# Patient Record
Sex: Male | Born: 1945
Health system: Southern US, Community
[De-identification: ages and names within clinical notes are randomized; demographics above are authoritative.]

## PROBLEM LIST (undated history)

## (undated) DIAGNOSIS — M1990 Unspecified osteoarthritis, unspecified site: Secondary | ICD-10-CM

## (undated) DIAGNOSIS — Z5189 Encounter for other specified aftercare: Secondary | ICD-10-CM

## (undated) DIAGNOSIS — C61 Malignant neoplasm of prostate: Secondary | ICD-10-CM

## (undated) DIAGNOSIS — J40 Bronchitis, not specified as acute or chronic: Secondary | ICD-10-CM

## (undated) DIAGNOSIS — C801 Malignant (primary) neoplasm, unspecified: Secondary | ICD-10-CM

## (undated) DIAGNOSIS — T148XXA Other injury of unspecified body region, initial encounter: Secondary | ICD-10-CM

## (undated) DIAGNOSIS — F32A Depression, unspecified: Secondary | ICD-10-CM

## (undated) DIAGNOSIS — F329 Major depressive disorder, single episode, unspecified: Secondary | ICD-10-CM

## (undated) DIAGNOSIS — IMO0001 Reserved for inherently not codable concepts without codable children: Secondary | ICD-10-CM

## (undated) DIAGNOSIS — I10 Essential (primary) hypertension: Secondary | ICD-10-CM

## (undated) DIAGNOSIS — K5790 Diverticulosis of intestine, part unspecified, without perforation or abscess without bleeding: Secondary | ICD-10-CM

## (undated) DIAGNOSIS — Z86718 Personal history of other venous thrombosis and embolism: Secondary | ICD-10-CM

## (undated) HISTORY — DX: Personal history of other venous thrombosis and embolism: Z86.718

## (undated) HISTORY — DX: Encounter for other specified aftercare: Z51.89

## (undated) HISTORY — DX: Diverticulosis of intestine, part unspecified, without perforation or abscess without bleeding: K57.90

## (undated) HISTORY — PX: HERNIA REPAIR: SHX51

## (undated) HISTORY — DX: Other injury of unspecified body region, initial encounter: T14.8XXA

## (undated) HISTORY — DX: Depression, unspecified: F32.A

## (undated) HISTORY — DX: Bronchitis, not specified as acute or chronic: J40

## (undated) HISTORY — DX: Major depressive disorder, single episode, unspecified: F32.9

## (undated) HISTORY — DX: Unspecified osteoarthritis, unspecified site: M19.90

## (undated) HISTORY — PX: CARDIAC CATHETERIZATION: SHX172

## (undated) HISTORY — PX: OTHER SURGICAL HISTORY: SHX169

## (undated) HISTORY — PX: PROSTATECTOMY: SHX69

## (undated) HISTORY — DX: Reserved for inherently not codable concepts without codable children: IMO0001

## (undated) HISTORY — DX: Essential (primary) hypertension: I10

---

## 1998-12-21 ENCOUNTER — Encounter: Payer: Self-pay | Admitting: Emergency Medicine

## 1998-12-21 ENCOUNTER — Emergency Department (HOSPITAL_COMMUNITY): Admission: EM | Admit: 1998-12-21 | Discharge: 1998-12-21 | Payer: Self-pay | Admitting: Emergency Medicine

## 1999-04-22 ENCOUNTER — Ambulatory Visit (HOSPITAL_COMMUNITY): Admission: RE | Admit: 1999-04-22 | Discharge: 1999-04-22 | Payer: Self-pay | Admitting: *Deleted

## 1999-04-22 ENCOUNTER — Encounter (INDEPENDENT_AMBULATORY_CARE_PROVIDER_SITE_OTHER): Payer: Self-pay | Admitting: Specialist

## 1999-08-29 ENCOUNTER — Observation Stay (HOSPITAL_COMMUNITY): Admission: EM | Admit: 1999-08-29 | Discharge: 1999-08-30 | Payer: Self-pay | Admitting: Emergency Medicine

## 1999-08-29 ENCOUNTER — Encounter: Payer: Self-pay | Admitting: Emergency Medicine

## 1999-12-01 ENCOUNTER — Encounter: Admission: RE | Admit: 1999-12-01 | Discharge: 1999-12-01 | Payer: Self-pay | Admitting: Internal Medicine

## 1999-12-01 ENCOUNTER — Encounter: Payer: Self-pay | Admitting: Internal Medicine

## 1999-12-21 ENCOUNTER — Encounter: Payer: Self-pay | Admitting: Internal Medicine

## 1999-12-21 ENCOUNTER — Encounter: Admission: RE | Admit: 1999-12-21 | Discharge: 1999-12-21 | Payer: Self-pay | Admitting: Internal Medicine

## 2004-04-16 ENCOUNTER — Emergency Department (HOSPITAL_COMMUNITY): Admission: EM | Admit: 2004-04-16 | Discharge: 2004-04-17 | Payer: Self-pay | Admitting: Emergency Medicine

## 2004-04-24 ENCOUNTER — Emergency Department (HOSPITAL_COMMUNITY): Admission: EM | Admit: 2004-04-24 | Discharge: 2004-04-24 | Payer: Self-pay | Admitting: Emergency Medicine

## 2004-05-25 ENCOUNTER — Emergency Department (HOSPITAL_COMMUNITY): Admission: EM | Admit: 2004-05-25 | Discharge: 2004-05-25 | Payer: Self-pay | Admitting: Emergency Medicine

## 2005-05-05 ENCOUNTER — Emergency Department (HOSPITAL_COMMUNITY): Admission: EM | Admit: 2005-05-05 | Discharge: 2005-05-06 | Payer: Self-pay | Admitting: Emergency Medicine

## 2005-09-07 ENCOUNTER — Emergency Department (HOSPITAL_COMMUNITY): Admission: EM | Admit: 2005-09-07 | Discharge: 2005-09-07 | Payer: Self-pay | Admitting: Emergency Medicine

## 2007-10-21 ENCOUNTER — Emergency Department (HOSPITAL_COMMUNITY): Admission: EM | Admit: 2007-10-21 | Discharge: 2007-10-21 | Payer: Self-pay | Admitting: Emergency Medicine

## 2007-10-22 ENCOUNTER — Emergency Department (HOSPITAL_COMMUNITY): Admission: EM | Admit: 2007-10-22 | Discharge: 2007-10-22 | Payer: Self-pay | Admitting: Emergency Medicine

## 2010-07-29 NOTE — H&P (Signed)
[place]. [facility]  Patient:    Glenn Logan, Glenn Logan                   MRN: [id number] Adm. Date:  [id number] Attending:  Thedore Mins                         History and Physical  CHIEF COMPLAINT: The patient comes in today complaining of intermittent chest pain over the last several hours prior to admission.  HISTORY OF PRESENT ILLNESS: The patient states that he had a very sharp pain and also this seemed to cause him some shortness of breath.  This may have been somewhat worse with deep inspiration.  According to his wife, the patient has had this intermittently over the last several weeks and was coming in to be evaluated.  The patient has no previous cardiac history but he is a smoker. The patient states that this pain is different because it would not go away today.  He has had this in the past and it typically would go away.  He did feel some generalized weakness associated with this.  PAST MEDICAL HISTORY: Unremarkable.  MEDICATIONS: None except p.r.n. Viagra.  SOCIAL HISTORY: The patient is married.  He does smoke and has occasional alcohol intake.  REVIEW OF SYSTEMS: As above.  The patient denies any visual change.  He does have arcus senilis.  There is no history of elevated cholesterol.  The patient denies any bowel or bladder changes.  Denies seizures, chills, or productive cough.  The patient has had no focal numbness or weakness.  PHYSICAL EXAMINATION:  VITAL SIGNS: Blood pressure 114/73, pulse 60, temperature 97.5 degrees, respiratory rate 20.  Oxygen saturation 100% on room air.  GENERAL: Well-developed, well-nourished man in no obvious distress.  HEENT: Head normocephalic, atraumatic.  PERRL.  Arcus senilis.  No papilledema.  TMs clear.  Oropharynx reveals pink and moist mucosa.  NECK: Supple, no thyromegaly, no adenopathy, no JVD.  HEART: Regular rhythm.  No murmurs, rubs, or gallops.  LUNGS: Clear bilaterally.  No rales  or wheezes.  ABDOMEN: Soft, nontender, nondistended.  Positive bowel sounds.  EXTREMITIES: No clubbing, cyanosis, or edema.  NEUROLOGIC: Moves all extremities.  ASSESSMENT/PLAN: Chest pain.  The patient has chest pain which is mildly atypical; however, he does have risk factor of being a smoker and being greater than age 64.  The patient has arcus senilis but his cholesterol was within a reasonable level when checked in the office.  At this point, I think the patient needs to be ruled out for myocardial infarction.  We will do this by enzymes and serial EKGs.  The patient will be placed on chest pain protocol and will be placed on heparin and nitroglycerin while this is worked up.  The patient will be seen by cardiology in the a.m.  The patient probably will not need a stress test done during hospitalization if he rules out; however, he will need to be set up for an exercise treadmill test or possibly Cardiolite as an outpatient.  The patient did have some nonspecific ST changes on his electrocardiogram which may be chronic and we will compare this to previous electrocardiogram done in the office. DD:  08/29/99 TD:  08/30/99 Job: [id number]
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
## 2010-11-22 ENCOUNTER — Encounter: Payer: Self-pay | Admitting: Gastroenterology

## 2010-12-09 LAB — POCT CARDIAC MARKERS
CKMB, poc: 1.1
Myoglobin, poc: 56.6

## 2010-12-09 LAB — POCT I-STAT, CHEM 8
BUN: 11
Calcium, Ion: 1.12
Chloride: 104
Creatinine, Ser: 1.5
Sodium: 140
TCO2: 27

## 2010-12-20 ENCOUNTER — Encounter: Payer: Self-pay | Admitting: Gastroenterology

## 2011-01-09 ENCOUNTER — Ambulatory Visit: Payer: Self-pay | Admitting: Gastroenterology

## 2011-03-20 ENCOUNTER — Ambulatory Visit: Payer: Self-pay | Admitting: Gastroenterology

## 2011-04-04 ENCOUNTER — Encounter: Payer: Self-pay | Admitting: Gastroenterology

## 2011-04-04 ENCOUNTER — Ambulatory Visit (INDEPENDENT_AMBULATORY_CARE_PROVIDER_SITE_OTHER): Payer: Self-pay | Admitting: Gastroenterology

## 2011-04-04 VITALS — BP 130/72 | HR 74 | Ht 66.5 in | Wt 140.8 lb

## 2011-04-04 DIAGNOSIS — K921 Melena: Secondary | ICD-10-CM

## 2011-04-04 DIAGNOSIS — Z7901 Long term (current) use of anticoagulants: Secondary | ICD-10-CM

## 2011-04-04 DIAGNOSIS — Z86010 Personal history of colonic polyps: Secondary | ICD-10-CM

## 2011-04-04 DIAGNOSIS — K5900 Constipation, unspecified: Secondary | ICD-10-CM

## 2011-04-04 MED ORDER — PEG-KCL-NACL-NASULF-NA ASC-C 100 G PO SOLR: 1.0000 | Freq: Once | ORAL | Status: DC
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
## 2011-04-04 NOTE — Patient Instructions (Signed)
You have been scheduled for a Colonoscopy with propofol. See separate instructions. Pick up your prep kit from your pharmacy.  Drink a 8 oz bottle of Magnesium Citrate on 04/15/11 ( 2 days prior to your procedure). Use Miralax mixing 17 grams in 8 oz of water 2-3 x daily for constipation.  cc: Juline Patch, MD

## 2011-04-06 ENCOUNTER — Telehealth: Payer: Self-pay

## 2011-04-06 NOTE — Telephone Encounter (Signed)
Dr. Thurston Hole returned my call and states that the patient can come off coumadin 5 days prior to his procedure but she will recommend at the patient's office visit tomorrow that he go on Lovenox bridge before the procedure. Dr. Randa Evens states that she will notify the patient tomorrow at his appt of her recommendations regarding stopping the coumadin 5 days prior. She states she has not received the faxed letter yet from [place] but hopes a verbal order is ok to use instead just in case she does not get the form. I told her that was fine as long as the patient gets the information at tomorrows visit. She agreed and will instruct the patient tomorrow.

## 2011-04-17 ENCOUNTER — Encounter: Payer: Self-pay | Admitting: Gastroenterology

## 2011-04-17 ENCOUNTER — Ambulatory Visit (AMBULATORY_SURGERY_CENTER): Payer: 59 | Admitting: Gastroenterology

## 2011-04-17 VITALS — BP 143/94 | HR 51 | Temp 96.4°F | Resp 20

## 2011-04-17 DIAGNOSIS — D126 Benign neoplasm of colon, unspecified: Secondary | ICD-10-CM

## 2011-04-17 DIAGNOSIS — Z86010 Personal history of colonic polyps: Secondary | ICD-10-CM

## 2011-04-17 DIAGNOSIS — K921 Melena: Secondary | ICD-10-CM

## 2011-04-17 DIAGNOSIS — K5900 Constipation, unspecified: Secondary | ICD-10-CM

## 2011-04-17 LAB — GLUCOSE, CAPILLARY
Glucose-Capillary: 151 mg/dL — ABNORMAL HIGH (ref 70–99)
Glucose-Capillary: 74 mg/dL (ref 70–99)
Glucose-Capillary: 209 mg/dL — ABNORMAL HIGH (ref 70–99)

## 2011-04-17 MED ORDER — SODIUM CHLORIDE 0.9 % IV SOLN: 500.0000 mL | INTRAVENOUS | Status: DC

## 2011-04-17 MED ORDER — DEXTROSE 5 % IV SOLN: INTRAVENOUS | Status: DC

## 2011-04-17 NOTE — Progress Notes (Signed)
Per the pt he took coumadin 10 mg last tues 04/11/2011.  He was on a lovenox bridge and he got mixed up and took lovenox Sunday 04/16/11 two times at 0930 and 23:45.  Natalie advised Dr. Russella Dar and he gave no orders.  MAW  D5W hung blood sugar was 74.  Dr. Russella Dar advised.  10:27 rechecked sugar 151. maw

## 2011-04-17 NOTE — Op Note (Signed)
[facility] [address] [place], [place]  [address]  COLONOSCOPY PROCEDURE REPORT PATIENT:  Lucifer, Soja  MR#:  [id number] BIRTHDATE:  [birth year], 65 yrs. old  GENDER:  male ENDOSCOPIST:  Judie Petit T. Russella Dar, MD, [facility]  PROCEDURE DATE:  04/17/2011 PROCEDURE:  Colonoscopy with snare polypectomy ASA CLASS:  Class III INDICATIONS:  1) hematochezia  2) constipation  3) history of pre-cancerous (adenomatous) colon polyps: 2001. MEDICATIONS:   MAC sedation, administered by CRNA, propofol (Diprivan) 300 mg IV DESCRIPTION OF PROCEDURE:   After the risks benefits and alternatives of the procedure were thoroughly explained, informed consent was obtained.  Digital rectal exam was performed and revealed external hemorrhoids.   The LB 180AL [id number] endoscope was introduced through the anus and advanced to the cecum, which was identified by both the appendix and ileocecal valve, without limitations.  The quality of the prep was good, using MoviPrep. The instrument was then slowly withdrawn as the colon was fully examined. <<PROCEDUREIMAGES>> FINDINGS:  A sessile polyp was found in the ascending colon. It was 7 mm in size. Polyp was snared without cautery. Retrieval was successful.  A sessile polyp was found in the descending colon. It was 5 mm in size. With standard forceps, biopsy was obtained and sent to pathology. Polyp was snared without cautery. Retrieval was successful. Mild diverticulosis was found in the sigmoid to descending colon.  Otherwise normal colonoscopy without other polyps, masses, vascular ectasias, or inflammatory changes. Retroflexed views in the rectum revealed internal hemorrhoids, moderate.  The time to cecum =  1.25  minutes. The scope was then withdrawn (time =  8.75  min) from the patient and the procedure completed. COMPLICATIONS:  None  ENDOSCOPIC IMPRESSION: 1) 7 mm sessile polyp in the ascending colon 2) 5 mm sessile polyp in the descending  colon 3) Mild diverticulosis in the sigmoid to descending colon 4) Internal and external hemorrhoids  RECOMMENDATIONS: 1) Hold aspirin, aspirin products, and anti-inflammatory medication for 2 weeks. 2) Await pathology results 3) High fiber diet with liberal fluid intake. 4) Repeat Colonoscopy in 5 years. 5) Resume Coumadin (warfarin) today and Lovenox tonight-follow all instructions from your prescribing MD.  Judie Petit T. Russella Dar, MD, [facility]  CC:  Juline Patch, MD  n. Rosalie DoctorVenita Lick. Latoya Maulding at 04/17/2011 11:01 AM  Luetta Nutting, [id number]
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
## 2011-04-17 NOTE — Patient Instructions (Signed)
Please read the handouts given to you by your recovery room nurse.    Your polyp results will be mailed to your home within two weeks.   You need to increase the fiber in your diet due to diverticulosis and hemorrhoids.    Resume your coumadin today and your lovenox tonight.   Please hold any aspirin or advil products for two weeks due to the use of heat on your polyps.   If you have any questions, please call us at [phone]. Thank-you.

## 2011-04-18 ENCOUNTER — Telehealth: Payer: Self-pay | Admitting: *Deleted

## 2011-04-18 NOTE — Telephone Encounter (Signed)
  Follow up Call-  Call back number 04/17/2011  Post procedure Call Back phone  # [phone] cell  Permission to leave phone message Yes     Patient questions:  Message left for patient to call us if necessary.

## 2011-04-21 ENCOUNTER — Encounter: Payer: Self-pay | Admitting: Gastroenterology

## 2011-08-08 ENCOUNTER — Ambulatory Visit: Payer: 59 | Admitting: Gastroenterology

## 2011-10-07 ENCOUNTER — Encounter (HOSPITAL_COMMUNITY): Payer: Self-pay | Admitting: Emergency Medicine

## 2011-10-07 DIAGNOSIS — Z87891 Personal history of nicotine dependence: Secondary | ICD-10-CM | POA: Insufficient documentation

## 2011-10-07 DIAGNOSIS — R0602 Shortness of breath: Secondary | ICD-10-CM | POA: Insufficient documentation

## 2011-10-07 DIAGNOSIS — J441 Chronic obstructive pulmonary disease with (acute) exacerbation: Secondary | ICD-10-CM | POA: Insufficient documentation

## 2011-10-07 DIAGNOSIS — Z79899 Other long term (current) drug therapy: Secondary | ICD-10-CM | POA: Insufficient documentation

## 2011-10-07 DIAGNOSIS — E119 Type 2 diabetes mellitus without complications: Secondary | ICD-10-CM | POA: Insufficient documentation

## 2011-10-07 DIAGNOSIS — Z7901 Long term (current) use of anticoagulants: Secondary | ICD-10-CM | POA: Insufficient documentation

## 2011-10-07 DIAGNOSIS — I10 Essential (primary) hypertension: Secondary | ICD-10-CM | POA: Insufficient documentation

## 2011-10-07 MED ORDER — ALBUTEROL SULFATE (5 MG/ML) 0.5% IN NEBU
INHALATION_SOLUTION | RESPIRATORY_TRACT | Status: AC
  Administered 2011-10-08: 5 mg
  Filled 2011-10-07: qty 1

## 2011-10-07 NOTE — ED Notes (Signed)
Patient comes in tripod position audible insp. And expiratory wheezing. The patient very labored in breathing

## 2011-10-08 ENCOUNTER — Emergency Department (HOSPITAL_COMMUNITY): Payer: 59

## 2011-10-08 ENCOUNTER — Emergency Department (HOSPITAL_COMMUNITY)
Admission: EM | Admit: 2011-10-08 | Discharge: 2011-10-08 | Disposition: A | Discharge status: Home / Self Care | Payer: 59 | Attending: Emergency Medicine | Admitting: Emergency Medicine

## 2011-10-08 DIAGNOSIS — J449 Chronic obstructive pulmonary disease, unspecified: Secondary | ICD-10-CM

## 2011-10-08 LAB — CBC
Hemoglobin: 16 g/dL (ref 13.0–17.0)
MCH: 30.5 pg (ref 26.0–34.0)
RBC: 5.24 MIL/uL (ref 4.22–5.81)

## 2011-10-08 LAB — BASIC METABOLIC PANEL
CO2: 24 mEq/L (ref 19–32)
Calcium: 9.4 mg/dL (ref 8.4–10.5)
Glucose, Bld: 116 mg/dL — ABNORMAL HIGH (ref 70–99)
Sodium: 140 mEq/L (ref 135–145)

## 2011-10-08 LAB — PROTIME-INR: INR: 3.22 — ABNORMAL HIGH (ref 0.00–1.49)

## 2011-10-08 MED ORDER — ALBUTEROL SULFATE (5 MG/ML) 0.5% IN NEBU
2.5000 mg | INHALATION_SOLUTION | Freq: Once | RESPIRATORY_TRACT | Status: AC
  Administered 2011-10-08: 2.5 mg via RESPIRATORY_TRACT
  Filled 2011-10-08: qty 0.5

## 2011-10-08 MED ORDER — SODIUM CHLORIDE 0.9 % IV SOLN
INTRAVENOUS | Status: DC
  Administered 2011-10-08: 01:00:00 via INTRAVENOUS

## 2011-10-08 MED ORDER — METHYLPREDNISOLONE SODIUM SUCC 125 MG IJ SOLR
125.0000 mg | Freq: Once | INTRAMUSCULAR | Status: AC
  Administered 2011-10-08: 125 mg via INTRAVENOUS
  Filled 2011-10-08: qty 2

## 2011-10-08 MED ORDER — PREDNISONE (PAK) 10 MG PO TABS: ORAL_TABLET | ORAL | Status: AC

## 2011-10-08 MED ORDER — IPRATROPIUM BROMIDE 0.02 % IN SOLN
0.5000 mg | Freq: Once | RESPIRATORY_TRACT | Status: AC
  Administered 2011-10-08: 0.5 mg via RESPIRATORY_TRACT
  Filled 2011-10-08: qty 2.5

## 2011-10-08 NOTE — ED Notes (Signed)
Pt c/o SOB and difficulty breathing x2 days. Pt states tonight is the worst it has been. Pt received breathing tx in triage and had relief. Pt states he feel "much better". Pt states this is the third time in the past year he's had an episode similar to this but has never been diagnosed with a respiratory disorder.
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
## 2011-10-08 NOTE — ED Notes (Signed)
GEX:BMWU<XL> Expected date:<BR> Expected time:<BR> Means of arrival:<BR> Comments:<BR> Triage 1

## 2011-10-17 ENCOUNTER — Ambulatory Visit (INDEPENDENT_AMBULATORY_CARE_PROVIDER_SITE_OTHER): Payer: 59 | Admitting: Gastroenterology

## 2011-10-17 ENCOUNTER — Encounter: Payer: Self-pay | Admitting: Gastroenterology

## 2011-10-17 VITALS — BP 130/80 | HR 87 | Ht 66.5 in | Wt 140.4 lb

## 2011-10-17 DIAGNOSIS — K921 Melena: Secondary | ICD-10-CM

## 2011-10-17 DIAGNOSIS — K5900 Constipation, unspecified: Secondary | ICD-10-CM

## 2011-10-17 MED ORDER — POLYETHYLENE GLYCOL 3350 17 GM/SCOOP PO POWD: ORAL | Status: DC

## 2011-10-17 MED ORDER — HYDROCORTISONE ACETATE 25 MG RE SUPP: 25.0000 mg | Freq: Every day | RECTAL | Status: DC

## 2011-10-17 NOTE — Progress Notes (Signed)
History of Present Illness: This is a 66 year old male who has had small volume hematochezia following manual disimpactions. He has had frequent constipation for about the past 3-6 months and describes incomplete evacuation and occasional mucus per rectum. He states he occasionally takes magnesium citrate. He manually disimpact times each week and occasionally notes rectal bleeding after the disimpaction. He underwent colonoscopy in February 2013 with 2 adenomatous polyps, diverticulosis and internal and external hemorrhoids were noted. He is maintained on Coumadin anticoagulation.  Current Medications, Allergies, Past Medical History, Past Surgical History, Family History and Social History were reviewed in [employer] record.  Physical Exam: General: Well developed , well nourished, no acute distress Head: Normocephalic and atraumatic Eyes:  sclerae anicteric, EOMI Ears: Normal auditory acuity Mouth: No deformity or lesions Lungs: Clear throughout to auscultation Heart: Regular rate and rhythm; no murmurs, rubs or bruits Abdomen: Soft, non tender and non distended. No masses, hepatosplenomegaly or hernias noted. Normal Bowel sounds Musculoskeletal: Symmetrical with no gross deformities  Pulses:  Normal pulses noted Extremities: No clubbing, cyanosis, edema or deformities noted Neurological: Alert oriented x 4, grossly nonfocal Psychological:  Alert and cooperative. Normal mood and affect  Assessment and Recommendations:  1. Small volume hematochezia likely secondary to internal and external hemorrhoids exacerbated by manual disimpactions. Discontinue manual disimpactions. Standard rectal care instructions and hemorrhoidal suppositories and creams as needed.  2. Constipation, incomplete evacuation. Begin a high fiber diet with adequate daily water intake and MiraLax once or twice daily titrated for adequate bowel movements. Avoid manual disimpaction.  3. Personal  history of adenomatous colon polyps. Surveillance colonoscopy recommended February 2018.  4. Chronic Coumadin anticoagulation.

## 2011-10-17 NOTE — Patient Instructions (Addendum)
We have sent the following medications to your pharmacy for you to pick up at your convenience: Hydrocortisone suppositories.   Start over the counter Miralax mixing 17 grams in 8 oz of water 1-2 x daily for constipation.  You have been given a High Fiber diet and constipation instructions.  cc: [facility]

## 2012-06-03 ENCOUNTER — Telehealth: Payer: Self-pay | Admitting: Gastroenterology

## 2012-06-03 NOTE — Telephone Encounter (Signed)
lmom for pt to call back. Last OV 10/17/11 for rectal bleeding after manual disimpactions. He was to start Miralax and a hi fiber diet.

## 2012-06-04 ENCOUNTER — Ambulatory Visit (INDEPENDENT_AMBULATORY_CARE_PROVIDER_SITE_OTHER): Payer: Medicare Other | Admitting: Gastroenterology

## 2012-06-04 ENCOUNTER — Encounter: Payer: Self-pay | Admitting: Gastroenterology

## 2012-06-04 ENCOUNTER — Other Ambulatory Visit (INDEPENDENT_AMBULATORY_CARE_PROVIDER_SITE_OTHER): Payer: Medicare Other

## 2012-06-04 VITALS — BP 130/70 | HR 72 | Ht 65.5 in | Wt 147.8 lb

## 2012-06-04 DIAGNOSIS — Z7901 Long term (current) use of anticoagulants: Secondary | ICD-10-CM

## 2012-06-04 DIAGNOSIS — K625 Hemorrhage of anus and rectum: Secondary | ICD-10-CM | POA: Insufficient documentation

## 2012-06-04 DIAGNOSIS — Z5181 Encounter for therapeutic drug level monitoring: Secondary | ICD-10-CM

## 2012-06-04 LAB — HEMOGLOBIN: Hemoglobin: 15.2 g/dL (ref 13.0–17.0)

## 2012-06-04 MED ORDER — HYDROCORTISONE ACETATE 25 MG RE SUPP: 25.0000 mg | Freq: Two times a day (BID) | RECTAL | Status: DC

## 2012-06-04 NOTE — Progress Notes (Signed)
Reviewed and agree with management plan.  Malcolm T. Stark, MD FACG 

## 2012-06-04 NOTE — Progress Notes (Signed)
06/04/2012 Glenn Logan [id number] Jun 13, 1945   History of Present Illness: This is a 68 year old male who has been experiencing rectal bleeding for the past 4-5 days.  Says that it only occurs when he moves his bowels; none in underpants and not passing only blood without stool.  No abdominal pain or rectal discomfort.  Has suffered from constipation for quite some time and was told to use Miralax daily at his last visit, but did not realize that it was OTC so he never got it until recently when his PCP at the [place] gave it to him.  Has only been using the Miralax for the past couple of days.  Says that this AM the bleeding looked like it was less than previously.  He underwent colonoscopy in February 2013 with 2 adenomatous polyps, diverticulosis and internal and external hemorrhoids were noted. He is maintained on Coumadin anticoagulation so of course he became concerned after a few days.   Current Medications, Allergies, Past Medical History, Past Surgical History, Family History and Social History were reviewed in [employer] record.   Physical Exam: BP 130/70  Pulse 72  Ht 5' 5.5" (1.664 m)  Wt 147 lb 12.8 oz (67.042 kg)  BMI 24.21 kg/m2 General: Well developed, black male in no acute distress Head: Normocephalic and atraumatic Eyes:  sclerae anicteric, conjunctiva pink  Ears: Normal auditory acuity Lungs: Clear throughout to auscultation Heart: Regular rate and rhythm Abdomen: Soft, non-tender and non-distended. No masses, no hepatomegaly. Normal bowel sounds. Rectal: No external abnormalities identified.  Small internal hemorrhoids felt.  No blood on exam glove. Musculoskeletal: Symmetrical with no gross deformities  Extremities: No edema  Neurological: Alert oriented x 4, grossly nonfocal Psychological:  Alert and cooperative. Normal mood and affect  Assessment and Recommendations: -Rectal bleeding:  Likely outlet bleed related to constipation and  straining.  Will give hydrocortisone suppositories to use BID for ten days.  Will check Hgb today.  To call on Friday with an update; go to ED if bleeding worsens. -Constipation:  Will continue Miralax at least once daily.  Can take two or three times a day if needed to keep stools soft. -Coumadin coagulopathy

## 2012-06-04 NOTE — Telephone Encounter (Signed)
Pt reports he's had heavy rectal bleeding for 4 days; he reports he is on coumadin. I asked if he still has constipation issues and he reports the [place] gave hime a powder?. He will see Doug Sou, PA today.

## 2012-06-04 NOTE — Patient Instructions (Signed)
We have sent the following medications to your pharmacy for you to pick up at your convenience: Rectal suppositories  Continue to take your polyethylene glycol powder every day.  You may use 2-3 doses a day as needed.  Your physician has requested that you go to the basement for the following lab work before leaving today: Hemoglobin  Call us Friday and update [place] on how your doing.    Go to the Emergency Room if bleeding worsens.

## 2012-06-16 ENCOUNTER — Emergency Department (HOSPITAL_COMMUNITY)
Admission: EM | Admit: 2012-06-16 | Discharge: 2012-06-16 | Discharge status: Against Medical Advice | Payer: Medicare Other

## 2013-09-16 ENCOUNTER — Emergency Department (HOSPITAL_COMMUNITY): Payer: Medicare HMO

## 2013-09-16 ENCOUNTER — Encounter (HOSPITAL_COMMUNITY): Payer: Self-pay | Admitting: Emergency Medicine

## 2013-09-16 ENCOUNTER — Emergency Department (HOSPITAL_COMMUNITY)
Admission: EM | Admit: 2013-09-16 | Discharge: 2013-09-17 | Disposition: A | Discharge status: Home / Self Care | Payer: Medicare HMO | Attending: Emergency Medicine | Admitting: Emergency Medicine

## 2013-09-16 DIAGNOSIS — Z87828 Personal history of other (healed) physical injury and trauma: Secondary | ICD-10-CM | POA: Insufficient documentation

## 2013-09-16 DIAGNOSIS — E119 Type 2 diabetes mellitus without complications: Secondary | ICD-10-CM | POA: Insufficient documentation

## 2013-09-16 DIAGNOSIS — Z86718 Personal history of other venous thrombosis and embolism: Secondary | ICD-10-CM | POA: Insufficient documentation

## 2013-09-16 DIAGNOSIS — Z860100 Personal history of colon polyps, unspecified: Secondary | ICD-10-CM | POA: Insufficient documentation

## 2013-09-16 DIAGNOSIS — Z8659 Personal history of other mental and behavioral disorders: Secondary | ICD-10-CM | POA: Insufficient documentation

## 2013-09-16 DIAGNOSIS — Z880 Allergy status to penicillin: Secondary | ICD-10-CM | POA: Insufficient documentation

## 2013-09-16 DIAGNOSIS — I10 Essential (primary) hypertension: Secondary | ICD-10-CM | POA: Insufficient documentation

## 2013-09-16 DIAGNOSIS — Z87891 Personal history of nicotine dependence: Secondary | ICD-10-CM | POA: Insufficient documentation

## 2013-09-16 DIAGNOSIS — Z9889 Other specified postprocedural states: Secondary | ICD-10-CM | POA: Insufficient documentation

## 2013-09-16 DIAGNOSIS — Z8719 Personal history of other diseases of the digestive system: Secondary | ICD-10-CM | POA: Insufficient documentation

## 2013-09-16 DIAGNOSIS — J441 Chronic obstructive pulmonary disease with (acute) exacerbation: Secondary | ICD-10-CM | POA: Insufficient documentation

## 2013-09-16 DIAGNOSIS — Z7901 Long term (current) use of anticoagulants: Secondary | ICD-10-CM | POA: Insufficient documentation

## 2013-09-16 DIAGNOSIS — Z79899 Other long term (current) drug therapy: Secondary | ICD-10-CM | POA: Insufficient documentation

## 2013-09-16 DIAGNOSIS — IMO0002 Reserved for concepts with insufficient information to code with codable children: Secondary | ICD-10-CM | POA: Insufficient documentation

## 2013-09-16 DIAGNOSIS — M129 Arthropathy, unspecified: Secondary | ICD-10-CM | POA: Insufficient documentation

## 2013-09-16 DIAGNOSIS — R079 Chest pain, unspecified: Secondary | ICD-10-CM | POA: Insufficient documentation

## 2013-09-16 LAB — CBC WITH DIFFERENTIAL/PLATELET
BASOS ABS: 0 10*3/uL (ref 0.0–0.1)
BASOS PCT: 0 % (ref 0–1)
EOS ABS: 0.8 10*3/uL — AB (ref 0.0–0.7)
EOS PCT: 11 % — AB (ref 0–5)
HEMATOCRIT: 47.8 % (ref 39.0–52.0)
Hemoglobin: 16.3 g/dL (ref 13.0–17.0)
Lymphocytes Relative: 39 % (ref 12–46)
Lymphs Abs: 2.7 10*3/uL (ref 0.7–4.0)
MCH: 30.9 pg (ref 26.0–34.0)
MCHC: 34.1 g/dL (ref 30.0–36.0)
MCV: 90.7 fL (ref 78.0–100.0)
MONO ABS: 0.5 10*3/uL (ref 0.1–1.0)
Monocytes Relative: 6 % (ref 3–12)
Neutro Abs: 3.1 10*3/uL (ref 1.7–7.7)
Neutrophils Relative %: 44 % (ref 43–77)
PLATELETS: 179 10*3/uL (ref 150–400)
RBC: 5.27 MIL/uL (ref 4.22–5.81)
RDW: 14.6 % (ref 11.5–15.5)
WBC: 7 10*3/uL (ref 4.0–10.5)

## 2013-09-16 LAB — BASIC METABOLIC PANEL
Anion gap: 14 (ref 5–15)
BUN: 15 mg/dL (ref 6–23)
CALCIUM: 9.7 mg/dL (ref 8.4–10.5)
CO2: 24 meq/L (ref 19–32)
CREATININE: 1.37 mg/dL — AB (ref 0.50–1.35)
Chloride: 103 mEq/L (ref 96–112)
GFR calc Af Amer: 61 mL/min — ABNORMAL LOW (ref >90)
GFR, EST NON AFRICAN AMERICAN: 52 mL/min — AB (ref >90)
GLUCOSE: 112 mg/dL — AB (ref 70–99)
Potassium: 4.3 mEq/L (ref 3.7–5.3)
Sodium: 141 mEq/L (ref 137–147)

## 2013-09-16 LAB — I-STAT TROPONIN, ED: TROPONIN I, POC: 0 ng/mL (ref 0.00–0.08)

## 2013-09-16 MED ORDER — PREDNISONE (PAK) 10 MG PO TABS: ORAL_TABLET | ORAL | Status: DC

## 2013-09-16 MED ORDER — METHYLPREDNISOLONE SODIUM SUCC 125 MG IJ SOLR
125.0000 mg | Freq: Once | INTRAMUSCULAR | Status: AC
  Administered 2013-09-16: 125 mg via INTRAVENOUS
  Filled 2013-09-16: qty 2

## 2013-09-16 MED ORDER — IPRATROPIUM-ALBUTEROL 0.5-2.5 (3) MG/3ML IN SOLN
3.0000 mL | Freq: Once | RESPIRATORY_TRACT | Status: AC
  Administered 2013-09-16: 3 mL via RESPIRATORY_TRACT
  Filled 2013-09-16: qty 3

## 2013-09-16 MED ORDER — ALBUTEROL SULFATE (2.5 MG/3ML) 0.083% IN NEBU
5.0000 mg | INHALATION_SOLUTION | Freq: Once | RESPIRATORY_TRACT | Status: AC
  Administered 2013-09-16: 5 mg via RESPIRATORY_TRACT
  Filled 2013-09-16: qty 6
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
## 2013-09-16 NOTE — ED Notes (Addendum)
Pt reports feeling much better.  RT notified re neb tx

## 2013-09-16 NOTE — ED Notes (Signed)
Pt reports L side cp and SOB x 3 days.  CP is non-radiating pressure which is intermittent.  Wheezing is audible.

## 2013-09-16 NOTE — Discharge Instructions (Signed)
Chronic Obstructive Pulmonary Disease Chronic obstructive pulmonary disease (COPD) is a common lung condition in which airflow from the lungs is limited. COPD is a general term that can be used to describe many different lung problems that limit airflow, including both chronic bronchitis and emphysema. If you have COPD, your lung function will probably never return to normal, but there are measures you can take to improve lung function and make yourself feel better.  CAUSES   Smoking (common).   Exposure to secondhand smoke.   Genetic problems.  Chronic inflammatory lung diseases or recurrent infections. SYMPTOMS   Shortness of breath, especially with physical activity.   Deep, persistent (chronic) cough with a large amount of thick mucus.   Wheezing.   Rapid breaths (tachypnea).   Gray or bluish discoloration (cyanosis) of the skin, especially in fingers, toes, or lips.   Fatigue.   Weight loss.   Frequent infections or episodes when breathing symptoms become much worse (exacerbations).   Chest tightness. DIAGNOSIS  Your healthcare provider will take a medical history and perform a physical examination to make the initial diagnosis. Additional tests for COPD may include:   Lung (pulmonary) function tests.  Chest X-ray.  CT scan.  Blood tests. TREATMENT  Treatment available to help you feel better when you have COPD include:   Inhaler and nebulizer medicines. These help manage the symptoms of COPD and make your breathing more comfortable  Supplemental oxygen. Supplemental oxygen is only helpful if you have a low oxygen level in your blood.   Exercise and physical activity. These are beneficial for nearly all people with COPD. Some people may also benefit from a pulmonary rehabilitation program. HOME CARE INSTRUCTIONS   Take all medicines (inhaled or pills) as directed by your health care provider.  Only take over-the-counter or prescription medicines  for pain, fever, or discomfort as directed by your health care provider.   Avoid over-the-counter medicines or cough syrups that dry up your airway (such as antihistamines) and slow down the elimination of secretions unless instructed otherwise by your healthcare provider.   If you are a smoker, the most important thing that you can do is stop smoking. Continuing to smoke will cause further lung damage and breathing trouble. Ask your health care provider for help with quitting smoking. He or she can direct you to community resources or hospitals that provide support.  Avoid exposure to irritants such as smoke, chemicals, and fumes that aggravate your breathing.  Use oxygen therapy and pulmonary rehabilitation if directed by your health care provider. If you require home oxygen therapy, ask your healthcare provider whether you should purchase a pulse oximeter to measure your oxygen level at home.   Avoid contact with individuals who have a contagious illness.  Avoid extreme temperature and humidity changes.  Eat healthy foods. Eating smaller, more frequent meals and resting before meals may help you maintain your strength.  Stay active, but balance activity with periods of rest. Exercise and physical activity will help you maintain your ability to do things you want to do.  Preventing infection and hospitalization is very important when you have COPD. Make sure to receive all the vaccines your health care provider recommends, especially the pneumococcal and influenza vaccines. Ask your healthcare provider whether you need a pneumonia vaccine.  Learn and use relaxation techniques to manage stress.  Learn and use controlled breathing techniques as directed by your health care provider. Controlled breathing techniques include:   Pursed lip breathing. Start by breathing   in (inhaling) through your nose for 1 second. Then, purse your lips as if you were going to whistle and breathe out (exhale)  through the pursed lips for 2 seconds.   Diaphragmatic breathing. Start by putting one hand on your abdomen just above your waist. Inhale slowly through your nose. The hand on your abdomen should move out. Then purse your lips and exhale slowly. You should be able to feel the hand on your abdomen moving in as you exhale.   Learn and use controlled coughing to clear mucus from your lungs. Controlled coughing is a series of short, progressive coughs. The steps of controlled coughing are:  1. Lean your head slightly forward.  2. Breathe in deeply using diaphragmatic breathing.  3. Try to hold your breath for 3 seconds.  4. Keep your mouth slightly open while coughing twice.  5. Spit any mucus out into a tissue.  6. Rest and repeat the steps once or twice as needed. SEEK MEDICAL CARE IF:   You are coughing up more mucus than usual.   There is a change in the color or thickness of your mucus.   Your breathing is more labored than usual.   Your breathing is faster than usual.  SEEK IMMEDIATE MEDICAL CARE IF:   You have shortness of breath while you are resting.   You have shortness of breath that prevents you from:  Being able to talk.   Performing your usual physical activities.   You have chest pain lasting longer than 5 minutes.   Your skin color is more cyanotic than usual.  You measure low oxygen saturations for longer than 5 minutes with a pulse oximeter. MAKE SURE YOU:   Understand these instructions.  Will watch your condition.  Will get help right away if you are not doing well or get worse. Document Released: 12/07/2004 Document Revised: 12/18/2012 Document Reviewed: 10/24/2012 [facility] Patient Information 2015 [place], [place]. This information is not intended to replace advice given to you by your health care provider. Make sure you discuss any questions you have with your health care provider.   Emergency Department Resource Guide 1) Find a Doctor  and Pay Out of Pocket Although you won't have to find out who is covered by your insurance plan, it is a good idea to ask around and get recommendations. You will then need to call the office and see if the doctor you have chosen will accept you as a new patient and what types of options they offer for patients who are self-pay. Some doctors offer discounts or will set up payment plans for their patients who do not have insurance, but you will need to ask so you aren't surprised when you get to your appointment.  2) Contact Your Local Health Department Not all health departments have doctors that can see patients for sick visits, but many do, so it is worth a call to see if yours does. If you don't know where your local health department is, you can check in your phone book. The CDC also has a tool to help you locate your state's health department, and many state websites also have listings of all of their local health departments.  3) Find a [facility] If your illness is not likely to be very severe or complicated, you may want to try a walk in clinic. These are popping up all over the country in pharmacies, drugstores, and shopping centers. They're usually staffed by nurse practitioners or physician assistants that  have been trained to treat common illnesses and complaints. They're usually fairly quick and inexpensive. However, if you have serious medical issues or chronic medical problems, these are probably not your best option.  No Primary Care Doctor: - Call Health Connect at  [phone] - they can help you locate a primary care doctor that  accepts your insurance, provides certain services, etc. - Physician Referral Service- [phone]  Chronic Pain Problems: Organization         Address  Phone   Notes  [facility]  [phone] Patients need to be referred by their primary care doctor.   Medication Assistance: Organization         Address  Phone    Notes  [facility] Medication [facility] [address] [place], [place] [address] [phone] --Must be a resident of [facility] -- Must have NO insurance coverage whatsoever (no Medicaid/ Medicare, etc.) -- The pt. MUST have a primary care doctor that directs their care regularly and follows them in the community   MedAssist  [phone]   [employer]  [phone]    Agencies that provide inexpensive medical care: Organization         Address  Phone   Notes  [place]  [phone]   [facility]    [phone]   [facility] [place], [place] [address] [phone]   [place] [address] [place] [phone]   Planned Parenthood    [phone]   [facility]    [phone]   [place] and [place] [address], [place] Phone:  [phone], Fax:  [phone] Hours of Operation:  9 am - 6 pm, M-F.  Also accepts Medicaid/Medicare and self-pay.  [facility] for [place], [address], [place] Phone: [phone], Fax: [phone]. Hours of Operation:  8:30 am - 5:30 pm, M-F.  Also accepts Medicaid and self-pay.  [facility] [place] [address] [place] Phone: [phone]   [place], [place], [place] [phone], Ext. [phone] Mondays & Thursdays: 7-9 AM.  First 15 patients are seen on a first come, first serve basis.    [place] Providers:  Organization         Address  Phone   Notes  [facility] [address], [place] [phone] Also accepts self-pay patients.  [facility] [address], [place]  [phone]   [place], [address], [place] [phone]   [facility] [address] [place] [phone]   Lucianne Lei [address], [place]   [phone] Only accepts [place] Access [place] patients after they have their name applied to their card.   Self-Pay (no insurance) in [facility]:  Organization         Address  Phone   Notes  Sickle Cell Patients, [facility] [place] [phone]   [facility] [place] [phone]   [facility] Urgent [place]  [place], [address], [place] [phone]   [facility]/Dr. Osei-Bonsu  [address]  or [place], [address], [place] [phone] Phone number for both [facility] and [place] locations is the same.  Urgent Medical and [facility] [address] [place] [phone]   [facility] [place] [address], [place] or [address] [phone]   [facility] [address] [place] [phone], phone; [phone], fax Sees patients 1st and 3rd Saturday of every month.  Must not qualify for public or private insurance (i.e. Medicaid, Medicare, [place] Health Choice, Veterans' Benefits)  Household income should be no more than 200% of the poverty level The clinic cannot treat you if you are pregnant or think you are pregnant  Sexually transmitted diseases are not treated at the clinic.   Dental Care: Organization         Address  Phone  Notes  [facility] Department of [facility] [phone] Accepts children up to age 12 who are enrolled in [place] or [place]; pregnant women with a Medicaid card; and children who have applied for Medicaid or [place] Health Choice, but were declined, whose parents can pay a reduced fee at time of service.  [facility] Department of [facility]  [address], [place] [phone] Accepts children up to age 80 who are enrolled in [place] or [place]; pregnant women with a Medicaid card; and children who have applied for Medicaid or [place] Health Choice, but were declined, whose parents can pay a reduced fee at time of service.  [place] Adult Dental Access PROGRAM  [place] [phone] Patients are seen by appointment only. Walk-ins are not accepted. [place] will see patients 22 years of age and older. Monday - Tuesday (8am-5pm) Most Wednesdays (8:30-5pm) $30 per visit, cash only  [facility] Adult Dental Access PROGRAM  [address], [facility] [phone] Patients are seen by appointment only. Walk-ins are not accepted. [place] will see patients 25 years of age and older. One Wednesday Evening (Monthly: Volunteer Based).  $30 per visit, cash only  [place]  [phone] for adults; Children under age 64, call Graduate Pediatric Dentistry at [phone]. Children aged 33-14, please call [phone] to request a pediatric application.  Dental services are provided in all areas of dental care including fillings, crowns and bridges, complete and partial dentures, implants, gum treatment, root canals, and extractions. Preventive care is also provided. Treatment is provided to both adults and children. Patients are selected via a lottery and there is often a waiting list.   [facility] [address], [place]  [phone] [email or web address]   [facility] [address] [place], [place] [phone], Ext. [phone] Second and Fourth Thursday of each month, opens at 6:30 AM; Clinic ends at 9 AM.  Patients are seen on a first-come first-served basis, and a limited number are seen during each clinic.   [facility]  [address] Hillard Danker [place], [place] [phone]   Eligibility Requirements You must have lived in  [place], [place], or [place] counties for at least the last three months.   You cannot be eligible for state or federal sponsored [employer], including [employer], [place], or [employer].   You generally cannot be eligible for healthcare insurance through your employer.    How to apply: Eligibility screenings  are held every Tuesday and Wednesday afternoon from 1:00 pm until 4:00 pm. You do not need an appointment for the interview!  [facility] [address], [place], [place]   [place]  [place] Department  [place]  [phone]    Behavioral Health Resources in the Community: Intensive Outpatient Programs Organization         Address  Phone  Notes  [place]. [address] [place], [place] [phone]   [facility] Outpatient [address] [place], [place]   ADS: Alcohol & Drug Svcs [address], [place], [place]   [place] [address],  [place], [place] or [phone]   Substance Abuse Resources Organization         Address  Phone  Notes  Alcohol and Drug Services  [phone]   [place]  [phone]   [facility] [place]   Chinita Pester  [phone]   Residential & Outpatient Substance Abuse Program  [phone]   Psychological Services Organization         Address  Phone  Notes  [facility] [place]  [place]  [phone]   [place] [address] [place] or [phone]    Mobile Crisis Teams Organization         Address  Phone  Notes  [facility], Mobile Crisis Care Unit  [phone]   [facility] Psychotherapeutic Services  [address] [place], [place]   [place] Levels [address] [place] [phone]    Self-Help/Support Groups Organization         Address  Phone             Notes  [place]. of [place] - variety of support groups  [place] Call for more information  Narcotics Anonymous (NA), Caring [facility] [address], [employer] [place]  2 meetings at this location   Special educational needs teacher         Address  Phone  Notes  [facility] [place],    [place]  [phone]   [facility]  [address] [place] [phone], [place], [place]   [place], [place] [phone] Admissions: 8am-3pm M-F  Incentives Substance [place] [address]    [place], [place] [phone]   [facility] [address] [place], [place], [place]   [facility] [address],  [place], [place]   [facility] Programs - Intensive Outpatient [address], [place], [place]   [facility] ([place].) [address].,  [place], [place] [phone] or [phone]   [facility] Services (RTS) [address] [place], [place] Accepts Medicaid  [facility] [place] [address],  [place] [phone] Substance Abuse/Addiction Treatment   [facility] Organization         Address  Phone  Notes  [facility]  [phone]   Domenic Schwab, PhD [address] Arlis [place]a [place], [place]   [phone] or [phone]   [place]   [address] [place], [place] [phone]   [facility] [place] [address], [place], [place] [phone] Insurance/Medicaid/sponsorship  through [facility] and Families [address] [place], [place] [phone] [place], [place] [phone]    Dr. Adele Schilder  [phone]   [facility] Dept. 1) [address] [place] 2) [place] 3)  [place] [address], [place] [phone]  [phone]   [place] [phone] or [phone] (After Hours)

## 2014-06-03 ENCOUNTER — Ambulatory Visit: Payer: Medicare Other | Admitting: Family Medicine

## 2014-06-12 ENCOUNTER — Emergency Department (INDEPENDENT_AMBULATORY_CARE_PROVIDER_SITE_OTHER)
Admission: EM | Admit: 2014-06-12 | Discharge: 2014-06-12 | Disposition: A | Discharge status: Home / Self Care | Payer: PPO | Source: Home / Self Care | Attending: Family Medicine | Admitting: Family Medicine

## 2014-06-12 ENCOUNTER — Encounter (HOSPITAL_COMMUNITY): Payer: Self-pay | Admitting: *Deleted

## 2014-06-12 DIAGNOSIS — N429 Disorder of prostate, unspecified: Secondary | ICD-10-CM | POA: Diagnosis not present

## 2014-06-12 NOTE — Discharge Instructions (Signed)
Call next week for lab result

## 2014-06-12 NOTE — ED Notes (Signed)
Pain in  recrectal  area  For  sev  Weeks       Frequent  Urination         No  Bleeding
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
## 2014-06-13 LAB — PSA: PSA: 25.31 ng/mL — ABNORMAL HIGH (ref <=4.00)

## 2014-06-17 ENCOUNTER — Telehealth (HOSPITAL_COMMUNITY): Payer: Self-pay | Admitting: *Deleted

## 2014-06-17 NOTE — ED Notes (Addendum)
PSA 25.31 H.  Lab shown to Dr. Bridgett Larsson. She said pt. needs to f/u with urologist. She sent his result to his PCP with a note.  I called pt. and left a message to call. Call 1. 06/17/2014 I talked to pt. And he said Dr. Minna Antis is not longer his doctor. He said he was seeing a Dr. Elisabeth Cara at the [facility] in [place].  He said he just got his records from the [place] and found out his PSA was 27.0 in 2014.  He said he has an appointment at [facility] on 5/25.  He wants to know if I can get him in sooner. He does not want to see Dr. Hulen Luster.  I told him I would call. Roselyn Meier 06/23/2014 I called [facility], and they said his appt. is 5/12. Scheduler had me talk to the RN.  He saw Dr. Hulen Luster in 2015 and PSA was 11.0. He was given antibiotics and instructed to get it rechecked in 4 weeks.  He did not come back.  I asked if he could be seen sooner.  She sent me back to scheduler and I was cut off.   I called back and spoke with Shirlean Mylar and she asked me to fax our notes to [phone].  Notes faxed.  I called pt. back and he said they already called him with an appointment in the AM with Dr. Alinda Money. 06/23/2014

## 2014-11-06 ENCOUNTER — Emergency Department (HOSPITAL_BASED_OUTPATIENT_CLINIC_OR_DEPARTMENT_OTHER)
Admission: EM | Admit: 2014-11-06 | Discharge: 2014-11-06 | Disposition: A | Discharge status: Home / Self Care | Payer: PPO | Attending: Emergency Medicine | Admitting: Emergency Medicine

## 2014-11-06 ENCOUNTER — Encounter (HOSPITAL_BASED_OUTPATIENT_CLINIC_OR_DEPARTMENT_OTHER): Payer: Self-pay | Admitting: *Deleted

## 2014-11-06 DIAGNOSIS — Z8719 Personal history of other diseases of the digestive system: Secondary | ICD-10-CM | POA: Diagnosis not present

## 2014-11-06 DIAGNOSIS — Z862 Personal history of diseases of the blood and blood-forming organs and certain disorders involving the immune mechanism: Secondary | ICD-10-CM | POA: Diagnosis not present

## 2014-11-06 DIAGNOSIS — Z7951 Long term (current) use of inhaled steroids: Secondary | ICD-10-CM | POA: Diagnosis not present

## 2014-11-06 DIAGNOSIS — Y846 Urinary catheterization as the cause of abnormal reaction of the patient, or of later complication, without mention of misadventure at the time of the procedure: Secondary | ICD-10-CM | POA: Diagnosis not present

## 2014-11-06 DIAGNOSIS — Z86018 Personal history of other benign neoplasm: Secondary | ICD-10-CM | POA: Diagnosis not present

## 2014-11-06 DIAGNOSIS — Z8739 Personal history of other diseases of the musculoskeletal system and connective tissue: Secondary | ICD-10-CM | POA: Diagnosis not present

## 2014-11-06 DIAGNOSIS — Z8659 Personal history of other mental and behavioral disorders: Secondary | ICD-10-CM | POA: Insufficient documentation

## 2014-11-06 DIAGNOSIS — Z8709 Personal history of other diseases of the respiratory system: Secondary | ICD-10-CM | POA: Diagnosis not present

## 2014-11-06 DIAGNOSIS — Z7901 Long term (current) use of anticoagulants: Secondary | ICD-10-CM | POA: Diagnosis not present

## 2014-11-06 DIAGNOSIS — Z880 Allergy status to penicillin: Secondary | ICD-10-CM | POA: Insufficient documentation

## 2014-11-06 DIAGNOSIS — I10 Essential (primary) hypertension: Secondary | ICD-10-CM | POA: Diagnosis not present

## 2014-11-06 DIAGNOSIS — T83028A Displacement of other indwelling urethral catheter, initial encounter: Secondary | ICD-10-CM | POA: Insufficient documentation

## 2014-11-06 DIAGNOSIS — Z87891 Personal history of nicotine dependence: Secondary | ICD-10-CM | POA: Diagnosis not present

## 2014-11-06 DIAGNOSIS — E119 Type 2 diabetes mellitus without complications: Secondary | ICD-10-CM | POA: Insufficient documentation

## 2014-11-06 DIAGNOSIS — Z859 Personal history of malignant neoplasm, unspecified: Secondary | ICD-10-CM | POA: Insufficient documentation

## 2014-11-06 DIAGNOSIS — T839XXA Unspecified complication of genitourinary prosthetic device, implant and graft, initial encounter: Secondary | ICD-10-CM

## 2014-11-06 HISTORY — DX: Malignant (primary) neoplasm, unspecified: C80.1

## 2014-11-06 MED ORDER — TRAMADOL HCL 50 MG PO TABS: 50.0000 mg | ORAL_TABLET | Freq: Three times a day (TID) | ORAL | Status: DC | PRN
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
## 2014-11-06 NOTE — ED Notes (Signed)
Replaced a foley bag only with a foley stabilization device on Rt upper leg
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
## 2014-11-06 NOTE — Discharge Instructions (Signed)
Foley Catheter Care °A Foley catheter is a soft, flexible tube that is placed into the bladder to drain urine. A Foley catheter may be inserted if: °· You leak urine or are not able to control when you urinate (urinary incontinence). °· You are not able to urinate when you need to (urinary retention). °· You had prostate surgery or surgery on the genitals. °· You have certain medical conditions, such as multiple sclerosis, dementia, or a spinal cord injury. °If you are going home with a Foley catheter in place, follow the instructions below. °TAKING CARE OF THE CATHETER °1. Wash your hands with soap and water. °2. Using mild soap and warm water on a clean washcloth: °· Clean the area on your body closest to the catheter insertion site using a circular motion, moving away from the catheter. Never wipe toward the catheter because this could sweep bacteria up into the urethra and cause infection. °· Remove all traces of soap. Pat the area dry with a clean towel. For males, reposition the foreskin. °3. Attach the catheter to your leg so there is no tension on the catheter. Use adhesive tape or a leg strap. If you are using adhesive tape, remove any sticky residue left behind by the previous tape you used. °4. Keep the drainage bag below the level of the bladder, but keep it off the floor. °5. Check throughout the day to be sure the catheter is working and urine is draining freely. Make sure the tubing does not become kinked. °6. Do not pull on the catheter or try to remove it. Pulling could damage internal tissues. °TAKING CARE OF THE DRAINAGE BAGS °You will be given two drainage bags to take home. One is a large overnight drainage bag, and the other is a smaller leg bag that fits underneath clothing. You may wear the overnight bag at any time, but you should never wear the smaller leg bag at night. Follow the instructions below for how to empty, change, and clean your drainage bags. °Emptying the Drainage Bag °You must  empty your drainage bag when it is  -½ full or at least 2-3 times a day. °1. Wash your hands with soap and water. °2. Keep the drainage bag below your hips, below the level of your bladder. This stops urine from going back into the tubing and into your bladder. °3. Hold the dirty bag over the toilet or a clean container. °4. Open the pour spout at the bottom of the bag and empty the urine into the toilet or container. Do not let the pour spout touch the toilet, container, or any other surface. Doing so can place bacteria on the bag, which can cause an infection. °5. Clean the pour spout with a gauze pad or cotton ball that has rubbing alcohol on it. °6. Close the pour spout. °7. Attach the bag to your leg with adhesive tape or a leg strap. °8. Wash your hands well. °Changing the Drainage Bag °Change your drainage bag once a month or sooner if it starts to smell bad or look dirty. Below are steps to follow when changing the drainage bag. °1. Wash your hands with soap and water. °2. Pinch off the rubber catheter so that urine does not spill out. °3. Disconnect the catheter tube from the drainage tube at the connection valve. Do not let the tubes touch any surface. °4. Clean the end of the catheter tube with an alcohol wipe. Use a different alcohol wipe to clean the   end of the drainage tube. °5. Connect the catheter tube to the drainage tube of the clean drainage bag. °6. Attach the new bag to the leg with adhesive tape or a leg strap. Avoid attaching the new bag too tightly. °7. Wash your hands well. °Cleaning the Drainage Bag °1. Wash your hands with soap and water. °2. Wash the bag in warm, soapy water. °3. Rinse the bag thoroughly with warm water. °4. Fill the bag with a solution of white vinegar and water (1 cup vinegar to 1 qt warm water [.2 L vinegar to 1 L warm water]). Close the bag and soak it for 30 minutes in the solution. °5. Rinse the bag with warm water. °6. Hang the bag to dry with the pour spout open  and hanging downward. °7. Store the clean bag (once it is dry) in a clean plastic bag. °8. Wash your hands well. °PREVENTING INFECTION °· Wash your hands before and after handling your catheter. °· Take showers daily and wash the area where the catheter enters your body. Do not take baths. Replace wet leg straps with dry ones, if this applies. °· Do not use powders, sprays, or lotions on the genital area. Only use creams, lotions, or ointments as directed by your caregiver. °· For females, wipe from front to back after each bowel movement. °· Drink enough fluids to keep your urine clear or pale yellow unless you have a fluid restriction. °· Do not let the drainage bag or tubing touch or lie on the floor. °· Wear cotton underwear to absorb moisture and to keep your skin drier. °SEEK MEDICAL CARE IF:  °· Your urine is cloudy or smells unusually bad. °· Your catheter becomes clogged. °· You are not draining urine into the bag or your bladder feels full. °· Your catheter starts to leak. °SEEK IMMEDIATE MEDICAL CARE IF:  °· You have pain, swelling, redness, or pus where the catheter enters the body. °· You have pain in the abdomen, legs, lower back, or bladder. °· You have a fever. °· You see blood fill the catheter, or your urine is pink or red. °· You have nausea, vomiting, or chills. °· Your catheter gets pulled out. °MAKE SURE YOU:  °· Understand these instructions. °· Will watch your condition. °· Will get help right away if you are not doing well or get worse. °Document Released: 02/27/2005 Document Revised: 07/14/2013 Document Reviewed: 02/19/2012 °ExitCare® Patient Information ©2015 [employer]. This information is not intended to replace advice given to you by your health care provider. Make sure you discuss any questions you have with your health care provider. ° °

## 2015-01-06 ENCOUNTER — Inpatient Hospital Stay (HOSPITAL_COMMUNITY)
Admission: EM | Admit: 2015-01-06 | Discharge: 2015-01-09 | DRG: 395 | Disposition: A | Discharge status: Home / Self Care | Payer: PPO | Attending: Internal Medicine | Admitting: Internal Medicine

## 2015-01-06 ENCOUNTER — Encounter (HOSPITAL_COMMUNITY): Payer: Self-pay | Admitting: Emergency Medicine

## 2015-01-06 ENCOUNTER — Emergency Department (HOSPITAL_COMMUNITY): Payer: PPO

## 2015-01-06 DIAGNOSIS — F4310 Post-traumatic stress disorder, unspecified: Secondary | ICD-10-CM | POA: Diagnosis present

## 2015-01-06 DIAGNOSIS — Z7984 Long term (current) use of oral hypoglycemic drugs: Secondary | ICD-10-CM

## 2015-01-06 DIAGNOSIS — Z86718 Personal history of other venous thrombosis and embolism: Secondary | ICD-10-CM

## 2015-01-06 DIAGNOSIS — I129 Hypertensive chronic kidney disease with stage 1 through stage 4 chronic kidney disease, or unspecified chronic kidney disease: Secondary | ICD-10-CM | POA: Diagnosis present

## 2015-01-06 DIAGNOSIS — Z87891 Personal history of nicotine dependence: Secondary | ICD-10-CM | POA: Diagnosis not present

## 2015-01-06 DIAGNOSIS — Z882 Allergy status to sulfonamides status: Secondary | ICD-10-CM

## 2015-01-06 DIAGNOSIS — K611 Rectal abscess: Secondary | ICD-10-CM | POA: Diagnosis present

## 2015-01-06 DIAGNOSIS — D649 Anemia, unspecified: Secondary | ICD-10-CM | POA: Diagnosis present

## 2015-01-06 DIAGNOSIS — K5900 Constipation, unspecified: Secondary | ICD-10-CM | POA: Diagnosis present

## 2015-01-06 DIAGNOSIS — F329 Major depressive disorder, single episode, unspecified: Secondary | ICD-10-CM | POA: Diagnosis present

## 2015-01-06 DIAGNOSIS — Z79899 Other long term (current) drug therapy: Secondary | ICD-10-CM

## 2015-01-06 DIAGNOSIS — I10 Essential (primary) hypertension: Secondary | ICD-10-CM | POA: Diagnosis present

## 2015-01-06 DIAGNOSIS — Z9079 Acquired absence of other genital organ(s): Secondary | ICD-10-CM | POA: Diagnosis not present

## 2015-01-06 DIAGNOSIS — M1990 Unspecified osteoarthritis, unspecified site: Secondary | ICD-10-CM | POA: Diagnosis present

## 2015-01-06 DIAGNOSIS — Z8546 Personal history of malignant neoplasm of prostate: Secondary | ICD-10-CM

## 2015-01-06 DIAGNOSIS — N183 Chronic kidney disease, stage 3 (moderate): Secondary | ICD-10-CM | POA: Diagnosis present

## 2015-01-06 DIAGNOSIS — Z880 Allergy status to penicillin: Secondary | ICD-10-CM

## 2015-01-06 DIAGNOSIS — Z7901 Long term (current) use of anticoagulants: Secondary | ICD-10-CM | POA: Diagnosis not present

## 2015-01-06 DIAGNOSIS — E1122 Type 2 diabetes mellitus with diabetic chronic kidney disease: Secondary | ICD-10-CM | POA: Diagnosis present

## 2015-01-06 DIAGNOSIS — L0390 Cellulitis, unspecified: Secondary | ICD-10-CM

## 2015-01-06 DIAGNOSIS — Z833 Family history of diabetes mellitus: Secondary | ICD-10-CM

## 2015-01-06 DIAGNOSIS — K6289 Other specified diseases of anus and rectum: Secondary | ICD-10-CM | POA: Diagnosis present

## 2015-01-06 DIAGNOSIS — E1129 Type 2 diabetes mellitus with other diabetic kidney complication: Secondary | ICD-10-CM | POA: Diagnosis present

## 2015-01-06 LAB — CBC
HCT: 39.5 % (ref 39.0–52.0)
HEMOGLOBIN: 13 g/dL (ref 13.0–17.0)
MCH: 30.2 pg (ref 26.0–34.0)
MCHC: 32.9 g/dL (ref 30.0–36.0)
MCV: 91.9 fL (ref 78.0–100.0)
Platelets: 223 10*3/uL (ref 150–400)
RBC: 4.3 MIL/uL (ref 4.22–5.81)
RDW: 14.9 % (ref 11.5–15.5)
WBC: 9.3 10*3/uL (ref 4.0–10.5)

## 2015-01-06 LAB — I-STAT CREATININE, ED: Creatinine, Ser: 1.6 mg/dL — ABNORMAL HIGH (ref 0.61–1.24)

## 2015-01-06 LAB — I-STAT CG4 LACTIC ACID, ED: LACTIC ACID, VENOUS: 0.38 mmol/L — AB (ref 0.5–2.0)

## 2015-01-06 MED ORDER — VANCOMYCIN HCL 10 G IV SOLR
1250.0000 mg | Freq: Once | INTRAVENOUS | Status: AC
  Administered 2015-01-07: 1250 mg via INTRAVENOUS
  Filled 2015-01-06: qty 1250

## 2015-01-06 MED ORDER — IOHEXOL 300 MG/ML  SOLN
100.0000 mL | Freq: Once | INTRAMUSCULAR | Status: AC | PRN
  Administered 2015-01-07: 100 mL via INTRAVENOUS

## 2015-01-06 MED ORDER — SODIUM CHLORIDE 0.9 % IV BOLUS (SEPSIS)
1000.0000 mL | Freq: Once | INTRAVENOUS | Status: AC
  Administered 2015-01-07: 1000 mL via INTRAVENOUS

## 2015-01-06 MED ORDER — HYDROCODONE-ACETAMINOPHEN 5-325 MG PO TABS
2.0000 | ORAL_TABLET | Freq: Once | ORAL | Status: AC
  Administered 2015-01-07: 2 via ORAL
  Filled 2015-01-06: qty 2

## 2015-01-06 MED ORDER — FENTANYL CITRATE (PF) 100 MCG/2ML IJ SOLN
50.0000 ug | Freq: Once | INTRAMUSCULAR | Status: AC
  Administered 2015-01-07: 50 ug via INTRAVENOUS
  Filled 2015-01-06: qty 2
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
## 2015-01-06 NOTE — ED Notes (Signed)
Pt states his left buttock is swollen and painful  Pt states it started about 2 weeks ago and has gotten worse

## 2015-01-07 ENCOUNTER — Encounter (HOSPITAL_COMMUNITY): Payer: Self-pay | Admitting: Internal Medicine

## 2015-01-07 DIAGNOSIS — I129 Hypertensive chronic kidney disease with stage 1 through stage 4 chronic kidney disease, or unspecified chronic kidney disease: Secondary | ICD-10-CM | POA: Diagnosis present

## 2015-01-07 DIAGNOSIS — Z7984 Long term (current) use of oral hypoglycemic drugs: Secondary | ICD-10-CM | POA: Diagnosis not present

## 2015-01-07 DIAGNOSIS — D649 Anemia, unspecified: Secondary | ICD-10-CM | POA: Diagnosis present

## 2015-01-07 DIAGNOSIS — N183 Chronic kidney disease, stage 3 (moderate): Secondary | ICD-10-CM

## 2015-01-07 DIAGNOSIS — K5900 Constipation, unspecified: Secondary | ICD-10-CM | POA: Diagnosis present

## 2015-01-07 DIAGNOSIS — K6289 Other specified diseases of anus and rectum: Secondary | ICD-10-CM | POA: Diagnosis not present

## 2015-01-07 DIAGNOSIS — Z7901 Long term (current) use of anticoagulants: Secondary | ICD-10-CM | POA: Diagnosis not present

## 2015-01-07 DIAGNOSIS — I10 Essential (primary) hypertension: Secondary | ICD-10-CM | POA: Diagnosis not present

## 2015-01-07 DIAGNOSIS — Z79899 Other long term (current) drug therapy: Secondary | ICD-10-CM | POA: Diagnosis not present

## 2015-01-07 DIAGNOSIS — E1122 Type 2 diabetes mellitus with diabetic chronic kidney disease: Secondary | ICD-10-CM | POA: Diagnosis present

## 2015-01-07 DIAGNOSIS — K611 Rectal abscess: Secondary | ICD-10-CM | POA: Diagnosis present

## 2015-01-07 DIAGNOSIS — F4310 Post-traumatic stress disorder, unspecified: Secondary | ICD-10-CM | POA: Diagnosis present

## 2015-01-07 DIAGNOSIS — F329 Major depressive disorder, single episode, unspecified: Secondary | ICD-10-CM | POA: Diagnosis present

## 2015-01-07 DIAGNOSIS — Z86718 Personal history of other venous thrombosis and embolism: Secondary | ICD-10-CM

## 2015-01-07 DIAGNOSIS — E1129 Type 2 diabetes mellitus with other diabetic kidney complication: Secondary | ICD-10-CM | POA: Diagnosis present

## 2015-01-07 DIAGNOSIS — Z882 Allergy status to sulfonamides status: Secondary | ICD-10-CM | POA: Diagnosis not present

## 2015-01-07 DIAGNOSIS — Z9079 Acquired absence of other genital organ(s): Secondary | ICD-10-CM | POA: Diagnosis not present

## 2015-01-07 DIAGNOSIS — Z880 Allergy status to penicillin: Secondary | ICD-10-CM | POA: Diagnosis not present

## 2015-01-07 DIAGNOSIS — Z833 Family history of diabetes mellitus: Secondary | ICD-10-CM | POA: Diagnosis not present

## 2015-01-07 DIAGNOSIS — Z8546 Personal history of malignant neoplasm of prostate: Secondary | ICD-10-CM | POA: Diagnosis not present

## 2015-01-07 DIAGNOSIS — Z87891 Personal history of nicotine dependence: Secondary | ICD-10-CM | POA: Diagnosis not present

## 2015-01-07 DIAGNOSIS — M1990 Unspecified osteoarthritis, unspecified site: Secondary | ICD-10-CM | POA: Diagnosis present

## 2015-01-07 LAB — GLUCOSE, CAPILLARY
GLUCOSE-CAPILLARY: 83 mg/dL (ref 65–99)
GLUCOSE-CAPILLARY: 120 mg/dL — AB (ref 65–99)
GLUCOSE-CAPILLARY: 87 mg/dL (ref 65–99)
Glucose-Capillary: 104 mg/dL — ABNORMAL HIGH (ref 65–99)
Glucose-Capillary: 122 mg/dL — ABNORMAL HIGH (ref 65–99)

## 2015-01-07 LAB — BASIC METABOLIC PANEL
Anion gap: 6 (ref 5–15)
BUN: 18 mg/dL (ref 6–20)
CO2: 28 mmol/L (ref 22–32)
Calcium: 8.9 mg/dL (ref 8.9–10.3)
Chloride: 106 mmol/L (ref 101–111)
Creatinine, Ser: 1.46 mg/dL — ABNORMAL HIGH (ref 0.61–1.24)
GFR calc Af Amer: 56 mL/min — ABNORMAL LOW (ref >60)
GFR, EST NON AFRICAN AMERICAN: 48 mL/min — AB (ref >60)
Glucose, Bld: 105 mg/dL — ABNORMAL HIGH (ref 65–99)
Potassium: 3.9 mmol/L (ref 3.5–5.1)
SODIUM: 140 mmol/L (ref 135–145)

## 2015-01-07 LAB — CBC WITH DIFFERENTIAL/PLATELET
BASOS ABS: 0 10*3/uL (ref 0.0–0.1)
Basophils Relative: 0 %
EOS PCT: 3 %
Eosinophils Absolute: 0.3 10*3/uL (ref 0.0–0.7)
HCT: 37.4 % — ABNORMAL LOW (ref 39.0–52.0)
HEMOGLOBIN: 12.3 g/dL — AB (ref 13.0–17.0)
LYMPHS ABS: 1.7 10*3/uL (ref 0.7–4.0)
LYMPHS PCT: 18 %
MCH: 30 pg (ref 26.0–34.0)
MCHC: 32.9 g/dL (ref 30.0–36.0)
MCV: 91.2 fL (ref 78.0–100.0)
Monocytes Absolute: 1 10*3/uL (ref 0.1–1.0)
Monocytes Relative: 10 %
NEUTROS ABS: 6.4 10*3/uL (ref 1.7–7.7)
NEUTROS PCT: 69 %
PLATELETS: 204 10*3/uL (ref 150–400)
RBC: 4.1 MIL/uL — AB (ref 4.22–5.81)
RDW: 14.7 % (ref 11.5–15.5)
WBC: 9.4 10*3/uL (ref 4.0–10.5)

## 2015-01-07 LAB — COMPREHENSIVE METABOLIC PANEL
ALK PHOS: 38 U/L (ref 38–126)
ALT: 12 U/L — AB (ref 17–63)
AST: 13 U/L — ABNORMAL LOW (ref 15–41)
Albumin: 3.2 g/dL — ABNORMAL LOW (ref 3.5–5.0)
Anion gap: 5 (ref 5–15)
BUN: 16 mg/dL (ref 6–20)
CHLORIDE: 105 mmol/L (ref 101–111)
CO2: 26 mmol/L (ref 22–32)
Calcium: 8.1 mg/dL — ABNORMAL LOW (ref 8.9–10.3)
Creatinine, Ser: 1.34 mg/dL — ABNORMAL HIGH (ref 0.61–1.24)
GFR, EST NON AFRICAN AMERICAN: 53 mL/min — AB (ref >60)
Glucose, Bld: 92 mg/dL (ref 65–99)
Potassium: 3.7 mmol/L (ref 3.5–5.1)
Sodium: 136 mmol/L (ref 135–145)
TOTAL PROTEIN: 6.3 g/dL — AB (ref 6.5–8.1)
Total Bilirubin: 1 mg/dL (ref 0.3–1.2)

## 2015-01-07 LAB — TYPE AND SCREEN
ABO/RH(D): O POS
Antibody Screen: NEGATIVE

## 2015-01-07 LAB — ABO/RH: ABO/RH(D): O POS

## 2015-01-07 LAB — PROTIME-INR
INR: 3.02 — ABNORMAL HIGH (ref 0.00–1.49)
PROTHROMBIN TIME: 30.8 s — AB (ref 11.6–15.2)

## 2015-01-07 MED ORDER — SODIUM CHLORIDE 0.9 % IV BOLUS (SEPSIS): 1000.0000 mL | Freq: Once | INTRAVENOUS | Status: DC

## 2015-01-07 MED ORDER — SENNOSIDES-DOCUSATE SODIUM 8.6-50 MG PO TABS
1.0000 | ORAL_TABLET | Freq: Every day | ORAL | Status: DC
  Administered 2015-01-08: 1 via ORAL
  Filled 2015-01-07 (×2): qty 1

## 2015-01-07 MED ORDER — ALBUTEROL SULFATE (2.5 MG/3ML) 0.083% IN NEBU: 2.5000 mg | INHALATION_SOLUTION | Freq: Every morning | RESPIRATORY_TRACT | Status: DC

## 2015-01-07 MED ORDER — INSULIN ASPART 100 UNIT/ML ~~LOC~~ SOLN
0.0000 [IU] | SUBCUTANEOUS | Status: DC
  Administered 2015-01-07: 1 [IU] via SUBCUTANEOUS
  Administered 2015-01-08 (×2): 2 [IU] via SUBCUTANEOUS

## 2015-01-07 MED ORDER — FLUTICASONE PROPIONATE 50 MCG/ACT NA SUSP
2.0000 | Freq: Every day | NASAL | Status: DC
  Administered 2015-01-07 – 2015-01-08 (×2): 2 via NASAL
  Filled 2015-01-07: qty 16

## 2015-01-07 MED ORDER — ACETAMINOPHEN 650 MG RE SUPP: 650.0000 mg | Freq: Four times a day (QID) | RECTAL | Status: DC | PRN

## 2015-01-07 MED ORDER — ACETAMINOPHEN 325 MG PO TABS
650.0000 mg | ORAL_TABLET | Freq: Four times a day (QID) | ORAL | Status: DC | PRN
  Administered 2015-01-07 – 2015-01-08 (×2): 650 mg via ORAL
  Filled 2015-01-07 (×2): qty 2

## 2015-01-07 MED ORDER — METRONIDAZOLE IN NACL 5-0.79 MG/ML-% IV SOLN
500.0000 mg | Freq: Three times a day (TID) | INTRAVENOUS | Status: DC
  Administered 2015-01-07 – 2015-01-08 (×5): 500 mg via INTRAVENOUS
  Filled 2015-01-07 (×6): qty 100

## 2015-01-07 MED ORDER — MORPHINE SULFATE (PF) 2 MG/ML IV SOLN
1.0000 mg | INTRAVENOUS | Status: DC | PRN
  Administered 2015-01-07 (×2): 1 mg via INTRAVENOUS
  Filled 2015-01-07 (×2): qty 1

## 2015-01-07 MED ORDER — ALBUTEROL SULFATE (2.5 MG/3ML) 0.083% IN NEBU
2.5000 mg | INHALATION_SOLUTION | Freq: Two times a day (BID) | RESPIRATORY_TRACT | Status: DC
  Administered 2015-01-07 (×2): 2.5 mg via RESPIRATORY_TRACT
  Filled 2015-01-07 (×2): qty 3

## 2015-01-07 MED ORDER — ZOLPIDEM TARTRATE 5 MG PO TABS
5.0000 mg | ORAL_TABLET | Freq: Every evening | ORAL | Status: DC | PRN
  Administered 2015-01-07 – 2015-01-08 (×3): 5 mg via ORAL
  Filled 2015-01-07 (×3): qty 1

## 2015-01-07 MED ORDER — ALBUTEROL SULFATE (2.5 MG/3ML) 0.083% IN NEBU
2.5000 mg | INHALATION_SOLUTION | Freq: Every morning | RESPIRATORY_TRACT | Status: DC
  Administered 2015-01-08: 2.5 mg via RESPIRATORY_TRACT
  Filled 2015-01-07 (×2): qty 3

## 2015-01-07 MED ORDER — LORATADINE 10 MG PO TABS
10.0000 mg | ORAL_TABLET | Freq: Every day | ORAL | Status: DC
  Administered 2015-01-07 – 2015-01-09 (×3): 10 mg via ORAL
  Filled 2015-01-07 (×3): qty 1

## 2015-01-07 MED ORDER — LOSARTAN POTASSIUM 50 MG PO TABS
50.0000 mg | ORAL_TABLET | Freq: Every day | ORAL | Status: DC
  Administered 2015-01-07 – 2015-01-09 (×3): 50 mg via ORAL
  Filled 2015-01-07 (×3): qty 1

## 2015-01-07 MED ORDER — CIPROFLOXACIN IN D5W 400 MG/200ML IV SOLN
400.0000 mg | Freq: Two times a day (BID) | INTRAVENOUS | Status: DC
  Administered 2015-01-07 – 2015-01-09 (×5): 400 mg via INTRAVENOUS
  Filled 2015-01-07 (×6): qty 200

## 2015-01-07 MED ORDER — ONDANSETRON HCL 4 MG/2ML IJ SOLN: 4.0000 mg | Freq: Four times a day (QID) | INTRAMUSCULAR | Status: DC | PRN

## 2015-01-07 MED ORDER — METRONIDAZOLE IN NACL 5-0.79 MG/ML-% IV SOLN
500.0000 mg | Freq: Once | INTRAVENOUS | Status: AC
  Administered 2015-01-07: 500 mg via INTRAVENOUS
  Filled 2015-01-07 (×2): qty 100

## 2015-01-07 MED ORDER — FINASTERIDE 5 MG PO TABS
5.0000 mg | ORAL_TABLET | Freq: Every day | ORAL | Status: DC
  Administered 2015-01-07 – 2015-01-09 (×3): 5 mg via ORAL
  Filled 2015-01-07 (×3): qty 1

## 2015-01-07 MED ORDER — CIPROFLOXACIN IN D5W 400 MG/200ML IV SOLN
400.0000 mg | Freq: Once | INTRAVENOUS | Status: AC
  Administered 2015-01-07: 400 mg via INTRAVENOUS
  Filled 2015-01-07: qty 200

## 2015-01-07 MED ORDER — POLYETHYLENE GLYCOL 3350 17 G PO PACK
17.0000 g | PACK | Freq: Every day | ORAL | Status: DC
  Administered 2015-01-07 – 2015-01-09 (×3): 17 g via ORAL
  Filled 2015-01-07 (×3): qty 1

## 2015-01-07 MED ORDER — ONDANSETRON HCL 4 MG PO TABS: 4.0000 mg | ORAL_TABLET | Freq: Four times a day (QID) | ORAL | Status: DC | PRN

## 2015-01-07 MED ORDER — SODIUM CHLORIDE 0.9 % IV SOLN
INTRAVENOUS | Status: AC
  Administered 2015-01-07 (×2): via INTRAVENOUS
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
## 2015-01-07 NOTE — Progress Notes (Signed)
[facility] PROGRESS NOTE  Glenn Logan NWG:[id number] DOB: Aug 26, 1945 DOA: 01/06/2015 PCP: Tommy Medal, MD  Assessment/Plan: Principle Problem Perirectal Abscess: Patient presented with left swollen and painful buttock, worsening x 2 weeks and fever of 100.5. CT shows a very small abscess within the left perirectal space. Patient on Coumadin for history of blood clots. Surgery consulted, patient favors conservative treatment . Continue IV antibiotics. Patient improved overnight. Consult surgery in the AM.  Active problems History of recurrent DVT: holding Coumadin and heparin for now in consideration of possible surgery.  Diabetes mellitus type 2- hold oral glyburide, place patient on sliding scale coverage. A1C pending.  Hypertension: continue current medications.  Constipation: Miralax ordered.  Chronic kidney disease stage III - patient having a baseline creatinine near 1.4. creatinine appears to be at baseline. Labs showing creatinine of 1.34 this morning.  Anemia-follow CBC  Prostate Cancer- resection in August 2016, following at [facility].    Code Status: Full code Family Communication: Family at bedside Disposition Plan: Inpatient   Consultants:  Surgery  Procedures:  none  Antibiotics: HPI/Subjective: Glenn Logan is a 69 y. O. Male with a history of DVT, hypertension and prostate cancer who presented to the ER with 2 weeks of worsening perirectal pain. Patient had also developed fever and chills. CT abdomen/pelvis showed a small perirectal abscess. Surgery was consulted and advised continuation of IV antibiotics and conservative management at this time. Patient has improved overnight and is in no acute distress on exam. Patient is constipated and cannot remember the last time he had a bowel movement, he denies nausea, vomiting, diarrhea or abdominal pain.  Objective: Filed Vitals:   01/07/15 1328  BP: 147/83  Pulse: 64  Temp: 98.4 F (36.9 C)   Resp: 18    Intake/Output Summary (Last 24 hours) at 01/07/15 1332 Last data filed at 01/07/15 1328  Gross per 24 hour  Intake    650 ml  Output   1675 ml  Net  -1025 ml   Filed Weights   01/07/15 0009 01/07/15 0258  Weight: 61.236 kg (135 lb) 63.4 kg (139 lb 12.4 oz)    Exam:  General: Moderately built and nourished.  ENT: No discharge from the ears eyes nose and mouth.  Cardiovascular: S1-S2 heard.  Respiratory: No rhonchi or crepitations. Normal effort.  Abdomen: Soft,  Non-tender, non-distended, normoactive bowel sounds present.  Skin: No rash.  Musculoskeletal: No edema.  Psychiatric: Appears normal.  Neurologic: Alert awake oriented to time place and person. Moves all extremities  Data Reviewed: Basic Metabolic Panel:  Recent Labs Lab 01/06/15 2324 01/06/15 2336 01/07/15 0402  NA 140  --  136  K 3.9  --  3.7  CL 106  --  105  CO2 28  --  26  GLUCOSE 105*  --  92  BUN 18  --  16  CREATININE 1.46* 1.60* 1.34*  CALCIUM 8.9  --  8.1*   Liver Function Tests:  Recent Labs Lab 01/07/15 0402  AST 13*  ALT 12*  ALKPHOS 38  BILITOT 1.0  PROT 6.3*  ALBUMIN 3.2*   No results for input(s): LIPASE, AMYLASE in the last 168 hours. No results for input(s): AMMONIA in the last 168 hours. CBC:  Recent Labs Lab 01/06/15 2324 01/07/15 0402  WBC 9.3 9.4  NEUTROABS  --  6.4  HGB 13.0 12.3*  HCT 39.5 37.4*  MCV 91.9 91.2  PLT 223 204   Cardiac Enzymes: No results for input(s): CKTOTAL, CKMB, CKMBINDEX, TROPONINI  in the last 168 hours. BNP (last 3 results) No results for input(s): BNP in the last 8760 hours.  ProBNP (last 3 results) No results for input(s): PROBNP in the last 8760 hours.  CBG:  Recent Labs Lab 01/07/15 0330 01/07/15 0739 01/07/15 1147  GLUCAP 104* 83 120*    No results found for this or any previous visit (from the past 240 hour(s)).   Studies: Ct Abdomen Pelvis W Contrast  01/07/2015  CLINICAL DATA:  Acute onset  of left buttock swelling and pain. Initial encounter. EXAM: CT ABDOMEN AND PELVIS WITH CONTRAST TECHNIQUE: Multidetector CT imaging of the abdomen and pelvis was performed using the standard protocol following bolus administration of intravenous contrast. CONTRAST:  11mL OMNIPAQUE IOHEXOL 300 MG/ML  SOLN COMPARISON:  None. FINDINGS: The visualized lung bases are clear. Scattered hepatic cysts measure up to 2.6 cm in size. The spleen is unremarkable in appearance. The gallbladder is within normal limits. The pancreas and adrenal glands are unremarkable. A 1.1 cm renal cyst is noted at the upper pole of the right kidney. A 4 mm stone is noted at the interpole region of the left kidney. Mild nonspecific perinephric stranding is noted bilaterally. There is no evidence of hydronephrosis. No obstructing ureteral stones are identified. No free fluid is identified. The small bowel is unremarkable in appearance. The stomach is within normal limits. No acute vascular abnormalities are seen. The appendix is normal in caliber and contains air, without evidence of appendicitis. Scattered diverticulosis is noted along the descending and proximal sigmoid colon, without evidence of diverticulitis. Postoperative change is noted along the midline anterior abdominal wall. The bladder is mildly distended and grossly unremarkable in appearance. The patient is status post prostatectomy. Mild surrounding postoperative stranding is noted. No inguinal lymphadenopathy is seen. A small peripherally enhancing abscess is noted posterior to the anorectal canal, measuring 2.2 x 2.0 cm, with surrounding soft tissue inflammation. Soft tissue inflammation extends superiorly about the rectum, and there is a 2.4 x 0.8 cm fluid collection along the left pelvic sidewall. No acute osseous abnormalities are identified. IMPRESSION: 1. Small peripherally enhancing abscess noted posterior to the anorectal canal, measuring 2.2 x 2.0 cm, with surrounding soft  tissue inflammation. Soft tissue inflammation extends superiorly about the rectum. 2.4 x 0.8 cm fluid collection noted along the left pelvic sidewall, which may reflect a small postoperative seroma or possibly additional abscess. 2. Postoperative soft tissue stranding noted about the prostate bed. 3. Small right renal cyst noted. 4 mm nonobstructing stone at the interpole region of the left kidney. 4. Scattered hepatic cysts noted. 5. Scattered diverticulosis along the descending and proximal sigmoid colon, without evidence of diverticulitis. Electronically Signed   By: Garald Balding [name]   On: 01/07/2015 00:50    Scheduled Meds: . albuterol  2.5 mg Nebulization BID  . ciprofloxacin  400 mg Intravenous Q12H  . finasteride  5 mg Oral Daily  . fluticasone  2 spray Each Nare Daily  . insulin aspart  0-9 Units Subcutaneous Q4H  . loratadine  10 mg Oral Daily  . losartan  50 mg Oral Daily  . metronidazole  500 mg Intravenous 3 times per day   Continuous Infusions: . sodium chloride 100 mL/hr at 01/07/15 7858    Active Problems:   Perirectal abscess   DM (diabetes mellitus), type 2 with renal complications (HCC)   Essential hypertension   History of DVT (deep vein thrombosis) Constipation   Time spent: 35 minutes    Wynetta Emery,  Bree PA-S [facility]  If 7PM-7AM, please contact night-coverage at [email or web address], password [facility] 01/07/2015, 1:32 PM  LOS: 0 days     Addendum  I personally evaluated patient on 01/07/2015 and agree with the above findings. Glenn Logan is a 69 year old gentleman with a history of diabetes mellitus, having a prior history of perirectal abscess, presented overnight with complaints of perirectal pain associated with fevers and chills. A CT scan showed small peripherally enhancing abscess noted posteriorly to the anorectal canal. General surgery was consulted and patient was started on empiric IV antibiotic therapy with ciprofloxacin and Flagyl. On physical  examination there was a region of induration without fluctuance over right perirectal region. He was nontoxic appearing, hemodynamically stable otherwise. Treatment options discussed with patient. For the time being patient elected nonoperative course.
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
## 2015-01-07 NOTE — ED Notes (Addendum)
Notified RN,Nash, pt. i-stat creatinine results 1.6. And [employer].
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
## 2015-01-08 DIAGNOSIS — I10 Essential (primary) hypertension: Secondary | ICD-10-CM

## 2015-01-08 DIAGNOSIS — Z86718 Personal history of other venous thrombosis and embolism: Secondary | ICD-10-CM

## 2015-01-08 DIAGNOSIS — K611 Rectal abscess: Principal | ICD-10-CM

## 2015-01-08 LAB — GLUCOSE, CAPILLARY
GLUCOSE-CAPILLARY: 110 mg/dL — AB (ref 65–99)
GLUCOSE-CAPILLARY: 77 mg/dL (ref 65–99)
GLUCOSE-CAPILLARY: 177 mg/dL — AB (ref 65–99)
GLUCOSE-CAPILLARY: 99 mg/dL (ref 65–99)
GLUCOSE-CAPILLARY: 86 mg/dL (ref 65–99)
Glucose-Capillary: 166 mg/dL — ABNORMAL HIGH (ref 65–99)

## 2015-01-08 LAB — BASIC METABOLIC PANEL
ANION GAP: 6 (ref 5–15)
BUN: 15 mg/dL (ref 6–20)
CALCIUM: 8.6 mg/dL — AB (ref 8.9–10.3)
CO2: 28 mmol/L (ref 22–32)
Chloride: 105 mmol/L (ref 101–111)
Creatinine, Ser: 1.51 mg/dL — ABNORMAL HIGH (ref 0.61–1.24)
GFR, EST AFRICAN AMERICAN: 53 mL/min — AB (ref >60)
GFR, EST NON AFRICAN AMERICAN: 46 mL/min — AB (ref >60)
GLUCOSE: 168 mg/dL — AB (ref 65–99)
Potassium: 3.8 mmol/L (ref 3.5–5.1)
Sodium: 139 mmol/L (ref 135–145)

## 2015-01-08 LAB — CBC
HCT: 37 % — ABNORMAL LOW (ref 39.0–52.0)
HEMOGLOBIN: 12 g/dL — AB (ref 13.0–17.0)
MCH: 29.6 pg (ref 26.0–34.0)
MCHC: 32.4 g/dL (ref 30.0–36.0)
MCV: 91.4 fL (ref 78.0–100.0)
PLATELETS: 213 10*3/uL (ref 150–400)
RBC: 4.05 MIL/uL — ABNORMAL LOW (ref 4.22–5.81)
RDW: 14.6 % (ref 11.5–15.5)
WBC: 11 10*3/uL — ABNORMAL HIGH (ref 4.0–10.5)

## 2015-01-08 LAB — PROTIME-INR
INR: 2.54 — AB (ref 0.00–1.49)
PROTHROMBIN TIME: 27 s — AB (ref 11.6–15.2)

## 2015-01-08 MED ORDER — WARFARIN - PHARMACIST DOSING INPATIENT: Freq: Every day | Status: DC

## 2015-01-08 MED ORDER — WARFARIN SODIUM 5 MG PO TABS
5.0000 mg | ORAL_TABLET | Freq: Once | ORAL | Status: AC
  Administered 2015-01-08: 5 mg via ORAL
  Filled 2015-01-08: qty 1

## 2015-01-08 NOTE — Care Management Important Message (Signed)
Important Message  Patient Details  Name: Glenn Logan MRN: [id number] Date of Birth: 08-13-1945   Medicare Important Message Given:  Yes-second notification given    Shelda Altes 01/08/2015, 2:27 [place] Message  Patient Details  Name: Glenn Logan MRN: [id number] Date of Birth: 11-05-1945   Medicare Important Message Given:  Yes-second notification given    Shelda Altes 01/08/2015, 2:26 PM

## 2015-01-08 NOTE — Progress Notes (Signed)
Rt went to give neb tx. Pt stated he wanted medication qhs so rt changed time to 20:00.

## 2015-01-08 NOTE — Progress Notes (Signed)
Subjective: He says it drained last PM.  He feels much better.  Objective: Vital signs in last 24 hours: Temp:  [98.1 F (36.7 C)-100.7 F (38.2 C)] 98.1 F (36.7 C) (10/28 0445) Pulse Rate:  [64-78] 64 (10/28 0445) Resp:  [18-20] 20 (10/28 0445) BP: (124-147)/(51-83) 135/51 mmHg (10/28 0445) SpO2:  [95 %-100 %] 100 % (10/28 0445) Last BM Date: 01/05/15 Carb modified diet TM 100.7 last PM WBC  11 K Creatinine is also up to 1.51 INR 2.54 Intake/Output from previous day: 10/27 0701 - 10/28 0700 In: 940 [P.O.:940] Out: 2650 [Urine:2650] Intake/Output this shift:    General appearance: alert, cooperative and no distress Skin: no drainage currently, I do not see a drainage site, but it isn't tender and I do not feel a fluid collection this AM, no erythema.  Lab Results:   Recent Labs  01/07/15 0402 01/08/15 0420  WBC 9.4 11.0*  HGB 12.3* 12.0*  HCT 37.4* 37.0*  PLT 204 213    BMET  Recent Labs  01/07/15 0402 01/08/15 0420  NA 136 139  K 3.7 3.8  CL 105 105  CO2 26 28  GLUCOSE 92 168*  BUN 16 15  CREATININE 1.34* 1.51*  CALCIUM 8.1* 8.6*   PT/INR  Recent Labs  01/07/15 0206 01/08/15 0420  LABPROT 30.8* 27.0*  INR 3.02* 2.54*     Recent Labs Lab 01/07/15 0402  AST 13*  ALT 12*  ALKPHOS 38  BILITOT 1.0  PROT 6.3*  ALBUMIN 3.2*     Lipase  No results found for: LIPASE   Studies/Results: Ct Abdomen Pelvis W Contrast  01/07/2015  CLINICAL DATA:  Acute onset of left buttock swelling and pain. Initial encounter. EXAM: CT ABDOMEN AND PELVIS WITH CONTRAST TECHNIQUE: Multidetector CT imaging of the abdomen and pelvis was performed using the standard protocol following bolus administration of intravenous contrast. CONTRAST:  148mL OMNIPAQUE IOHEXOL 300 MG/ML  SOLN COMPARISON:  None. FINDINGS: The visualized lung bases are clear. Scattered hepatic cysts measure up to 2.6 cm in size. The spleen is unremarkable in appearance. The gallbladder is  within normal limits. The pancreas and adrenal glands are unremarkable. A 1.1 cm renal cyst is noted at the upper pole of the right kidney. A 4 mm stone is noted at the interpole region of the left kidney. Mild nonspecific perinephric stranding is noted bilaterally. There is no evidence of hydronephrosis. No obstructing ureteral stones are identified. No free fluid is identified. The small bowel is unremarkable in appearance. The stomach is within normal limits. No acute vascular abnormalities are seen. The appendix is normal in caliber and contains air, without evidence of appendicitis. Scattered diverticulosis is noted along the descending and proximal sigmoid colon, without evidence of diverticulitis. Postoperative change is noted along the midline anterior abdominal wall. The bladder is mildly distended and grossly unremarkable in appearance. The patient is status post prostatectomy. Mild surrounding postoperative stranding is noted. No inguinal lymphadenopathy is seen. A small peripherally enhancing abscess is noted posterior to the anorectal canal, measuring 2.2 x 2.0 cm, with surrounding soft tissue inflammation. Soft tissue inflammation extends superiorly about the rectum, and there is a 2.4 x 0.8 cm fluid collection along the left pelvic sidewall. No acute osseous abnormalities are identified. IMPRESSION: 1. Small peripherally enhancing abscess noted posterior to the anorectal canal, measuring 2.2 x 2.0 cm, with surrounding soft tissue inflammation. Soft tissue inflammation extends superiorly about the rectum. 2.4 x 0.8 cm fluid collection noted along the left pelvic  sidewall, which may reflect a small postoperative seroma or possibly additional abscess. 2. Postoperative soft tissue stranding noted about the prostate bed. 3. Small right renal cyst noted. 4 mm nonobstructing stone at the interpole region of the left kidney. 4. Scattered hepatic cysts noted. 5. Scattered diverticulosis along the descending  and proximal sigmoid colon, without evidence of diverticulitis. Electronically Signed   By: Garald Balding [name]   On: 01/07/2015 00:50    Medications: . albuterol  2.5 mg Nebulization q morning - 10a  . ciprofloxacin  400 mg Intravenous Q12H  . finasteride  5 mg Oral Daily  . fluticasone  2 spray Each Nare Daily  . insulin aspart  0-9 Units Subcutaneous Q4H  . loratadine  10 mg Oral Daily  . losartan  50 mg Oral Daily  . metronidazole  500 mg Intravenous 3 times per day  . polyethylene glycol  17 g Oral Daily  . senna-docusate  1 tablet Oral QHS  . warfarin  5 mg Oral ONCE-1800  . Warfarin - Pharmacist Dosing Inpatient   Does not apply q1800     Prior to Admission medications   Medication Sig Start Date End Date Taking? Authorizing Provider  albuterol (ACCUNEB) 0.63 MG/3ML nebulizer solution Take 1 ampule by nebulization 2 (two) times daily.   Yes Historical Provider, MD  cetirizine (ZYRTEC) 10 MG tablet Take 10 mg by mouth daily.   Yes Historical Provider, MD  docusate sodium (COLACE) 100 MG capsule Take 100 mg by mouth 3 (three) times daily.   Yes Historical Provider, MD  finasteride (PROSCAR) 5 MG tablet Take 5 mg by mouth daily.   Yes Historical Provider, MD  fluticasone (FLONASE) 50 MCG/ACT nasal spray Place 2 sprays into both nostrils daily.   Yes Historical Provider, MD  glyBURIDE (DIABETA) 1.25 MG tablet Take 1.25 mg by mouth daily with breakfast.   Yes Historical Provider, MD  losartan (COZAAR) 100 MG tablet Take 50 mg by mouth daily.   Yes Historical Provider, MD  Multiple Vitamin (MULTIVITAMIN WITH MINERALS) TABS tablet Take 1 tablet by mouth daily.   Yes Historical Provider, MD  warfarin (COUMADIN) 5 MG tablet Take 5-7.5 mg by mouth daily. Take 1 tablet (5mg ) daily except Tuesday, Thursday, and Sat. Take 1&1/2 tab (7.5 mg)   Yes Historical Provider, MD  zolpidem (AMBIEN) 10 MG tablet Take 5 mg by mouth at bedtime as needed for sleep.   Yes Historical Provider, MD  predniSONE  (STERAPRED UNI-PAK) 10 MG tablet Take 6 tabs by mouth daily  for 2 days, then 5 tabs for 2 days, then 4 tabs for 2 days, then 3 tabs for 2 days, 2 tabs for 2 days, then 1 tab by mouth daily for 2 days Patient not taking: Reported on 01/06/2015 09/16/13   Dorie Rank, MD  traMADol (ULTRAM) 50 MG tablet Take 1 tablet (50 mg total) by mouth every 8 (eight) hours as needed. Patient not taking: Reported on 01/06/2015 11/06/14   Quintella Reichert, MD     Assessment/Plan Perirectal abscess Hx of DVT on coumadin Hypertension Chronic kidney disease stage III Anemia Prostate cancer Antibiotics:   Day 3 Cipro/Flagyl  He also had 1 dose of Vancomycin DVT:  therapeutic on coumadin    Plan:  I would add sitz baths, and continue medical management.    LOS: 1 day    Waylynn Benefiel 01/08/2015

## 2015-01-08 NOTE — Progress Notes (Signed)
[facility] PROGRESS NOTE  Glenn Logan:[id number] DOB: 1945-10-09 DOA: 01/06/2015 PCP: Tommy Medal, MD  Assessment/Plan: Principle Problem Perirectal Abscess:  -Patient presented with left swollen and painful buttock, worsening x 2 weeks and fever of 100.5. CT shows a very small abscess within the left perirectal space. Patient on Coumadin for history of blood clots.  -Patient expressing interest in non-operative management -Continue Cipro Floxin 400 mg IV twice a day and Flagyl 500 mg IV every 8 hours -He rates hemodynamically stable, nontoxic on exam, and reports feeling better today  Active problems History of recurrent DVT:  -Pharmacy consulted for Coumadin dosing.  -INR therapeutic at 2.54 on 01/08/2015  Diabetes mellitus type 2- hold oral glyburide, place patient on sliding scale coverage. A1C pending.  Hypertension:  -Blood pressures are stable, having systolic blood pressures Flexeril between 120s and 140s -Continue losartan 50 mg by mouth daily  Constipation:  -Miralax ordered.  Chronic kidney disease stage III  - Patient having a baseline creatinine near 1.4. creatinine appears to be at baseline. Labs showing creatinine of 1.34 this morning.  Anemia-follow CBC  Prostate Cancer- resection in August 2016, following at [facility], [place].    Code Status: Full code Family Communication: Family at bedside Disposition Plan: Inpatient   Consultants:  Surgery  Procedures:  none  Antibiotics: HPI/Subjective: Glenn Logan is a 69 y. O. Male with a history of DVT, hypertension and prostate cancer who presented to the ER with 2 weeks of worsening perirectal pain. Patient had also developed fever and chills. CT abdomen/pelvis showed a small perirectal abscess. Surgery was consulted and advised continuation of IV antibiotics and conservative management at this time. Patient has improved overnight and is in no acute distress on exam. Patient is constipated and  cannot remember the last time he had a bowel movement, he denies nausea, vomiting, diarrhea or abdominal pain.  Objective: Filed Vitals:   01/08/15 0445  BP: 135/51  Pulse: 64  Temp: 98.1 F (36.7 C)  Resp: 20    Intake/Output Summary (Last 24 hours) at 01/08/15 1147 Last data filed at 01/08/15 0955  Gross per 24 hour  Intake    940 ml  Output   2750 ml  Net  -1810 ml   Filed Weights   01/07/15 0009 01/07/15 0258  Weight: 61.236 kg (135 lb) 63.4 kg (139 lb 12.4 oz)    Exam:  General: Moderately built and nourished, reports feeling better he is awake and alert oriented, nontoxic-appearing  ENT: No discharge from the ears eyes nose and mouth.  Cardiovascular: S1-S2 heard.  Respiratory: No rhonchi or crepitations. Normal effort.  Abdomen: Soft,  Non-tender, non-distended, normoactive bowel sounds present.  Skin: No rash.  Musculoskeletal: No edema.  Psychiatric: Appears normal.  Neurologic: Alert awake oriented to time place and person. Moves all extremities  Data Reviewed: Basic Metabolic Panel:  Recent Labs Lab 01/06/15 2324 01/06/15 2336 01/07/15 0402 01/08/15 0420  NA 140  --  136 139  K 3.9  --  3.7 3.8  CL 106  --  105 105  CO2 28  --  26 28  GLUCOSE 105*  --  92 168*  BUN 18  --  16 15  CREATININE 1.46* 1.60* 1.34* 1.51*  CALCIUM 8.9  --  8.1* 8.6*   Liver Function Tests:  Recent Labs Lab 01/07/15 0402  AST 13*  ALT 12*  ALKPHOS 38  BILITOT 1.0  PROT 6.3*  ALBUMIN 3.2*   No results for input(s): LIPASE, AMYLASE in  the last 168 hours. No results for input(s): AMMONIA in the last 168 hours. CBC:  Recent Labs Lab 01/06/15 2324 01/07/15 0402 01/08/15 0420  WBC 9.3 9.4 11.0*  NEUTROABS  --  6.4  --   HGB 13.0 12.3* 12.0*  HCT 39.5 37.4* 37.0*  MCV 91.9 91.2 91.4  PLT 223 204 213   Cardiac Enzymes: No results for input(s): CKTOTAL, CKMB, CKMBINDEX, TROPONINI in the last 168 hours. BNP (last 3 results) No results for input(s):  BNP in the last 8760 hours.  ProBNP (last 3 results) No results for input(s): PROBNP in the last 8760 hours.  CBG:  Recent Labs Lab 01/07/15 1711 01/07/15 2038 01/08/15 0105 01/08/15 0432 01/08/15 0738  GLUCAP 87 122* 110* 166* 77    No results found for this or any previous visit (from the past 240 hour(s)).   Studies: Ct Abdomen Pelvis W Contrast  01/07/2015  CLINICAL DATA:  Acute onset of left buttock swelling and pain. Initial encounter. EXAM: CT ABDOMEN AND PELVIS WITH CONTRAST TECHNIQUE: Multidetector CT imaging of the abdomen and pelvis was performed using the standard protocol following bolus administration of intravenous contrast. CONTRAST:  159mL OMNIPAQUE IOHEXOL 300 MG/ML  SOLN COMPARISON:  None. FINDINGS: The visualized lung bases are clear. Scattered hepatic cysts measure up to 2.6 cm in size. The spleen is unremarkable in appearance. The gallbladder is within normal limits. The pancreas and adrenal glands are unremarkable. A 1.1 cm renal cyst is noted at the upper pole of the right kidney. A 4 mm stone is noted at the interpole region of the left kidney. Mild nonspecific perinephric stranding is noted bilaterally. There is no evidence of hydronephrosis. No obstructing ureteral stones are identified. No free fluid is identified. The small bowel is unremarkable in appearance. The stomach is within normal limits. No acute vascular abnormalities are seen. The appendix is normal in caliber and contains air, without evidence of appendicitis. Scattered diverticulosis is noted along the descending and proximal sigmoid colon, without evidence of diverticulitis. Postoperative change is noted along the midline anterior abdominal wall. The bladder is mildly distended and grossly unremarkable in appearance. The patient is status post prostatectomy. Mild surrounding postoperative stranding is noted. No inguinal lymphadenopathy is seen. A small peripherally enhancing abscess is noted posterior  to the anorectal canal, measuring 2.2 x 2.0 cm, with surrounding soft tissue inflammation. Soft tissue inflammation extends superiorly about the rectum, and there is a 2.4 x 0.8 cm fluid collection along the left pelvic sidewall. No acute osseous abnormalities are identified. IMPRESSION: 1. Small peripherally enhancing abscess noted posterior to the anorectal canal, measuring 2.2 x 2.0 cm, with surrounding soft tissue inflammation. Soft tissue inflammation extends superiorly about the rectum. 2.4 x 0.8 cm fluid collection noted along the left pelvic sidewall, which may reflect a small postoperative seroma or possibly additional abscess. 2. Postoperative soft tissue stranding noted about the prostate bed. 3. Small right renal cyst noted. 4 mm nonobstructing stone at the interpole region of the left kidney. 4. Scattered hepatic cysts noted. 5. Scattered diverticulosis along the descending and proximal sigmoid colon, without evidence of diverticulitis. Electronically Signed   By: Garald Balding [name]   On: 01/07/2015 00:50    Scheduled Meds: . albuterol  2.5 mg Nebulization q morning - 10a  . ciprofloxacin  400 mg Intravenous Q12H  . finasteride  5 mg Oral Daily  . fluticasone  2 spray Each Nare Daily  . insulin aspart  0-9 Units Subcutaneous Q4H  .  loratadine  10 mg Oral Daily  . losartan  50 mg Oral Daily  . metronidazole  500 mg Intravenous 3 times per day  . polyethylene glycol  17 g Oral Daily  . senna-docusate  1 tablet Oral QHS  . warfarin  5 mg Oral ONCE-1800  . Warfarin - Pharmacist Dosing Inpatient   Does not apply q1800   Continuous Infusions:    Active Problems:   Perirectal abscess   DM (diabetes mellitus), type 2 with renal complications (HCC)   Essential hypertension   History of DVT (deep vein thrombosis) Constipation   Time spent: 25 minutes    Kelvin Cellar MD [facility]  If 7PM-7AM, please contact night-coverage at [email or web address], password [facility] 01/08/2015,  11:47 AM  LOS: 1 day
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
## 2015-01-09 LAB — GLUCOSE, CAPILLARY
GLUCOSE-CAPILLARY: 106 mg/dL — AB (ref 65–99)
GLUCOSE-CAPILLARY: 95 mg/dL (ref 65–99)
Glucose-Capillary: 93 mg/dL (ref 65–99)
Glucose-Capillary: 122 mg/dL — ABNORMAL HIGH (ref 65–99)

## 2015-01-09 LAB — HEMOGLOBIN A1C
Hgb A1c MFr Bld: 5.9 % — ABNORMAL HIGH (ref 4.8–5.6)
Mean Plasma Glucose: 123 mg/dL

## 2015-01-09 LAB — PROTIME-INR
INR: 2 — ABNORMAL HIGH (ref 0.00–1.49)
PROTHROMBIN TIME: 22.6 s — AB (ref 11.6–15.2)

## 2015-01-09 MED ORDER — OXYCODONE HCL 5 MG PO TABS: 5.0000 mg | ORAL_TABLET | Freq: Four times a day (QID) | ORAL | Status: DC | PRN

## 2015-01-09 MED ORDER — CIPROFLOXACIN HCL 500 MG PO TABS
500.0000 mg | ORAL_TABLET | Freq: Two times a day (BID) | ORAL | Status: DC
Start: 2015-01-09 — End: 2015-01-09

## 2015-01-09 MED ORDER — METRONIDAZOLE 500 MG PO TABS
500.0000 mg | ORAL_TABLET | Freq: Once | ORAL | Status: AC
Start: 2015-01-09 — End: 2015-01-09
  Administered 2015-01-09: 500 mg via ORAL
  Filled 2015-01-09: qty 1

## 2015-01-09 MED ORDER — POLYETHYLENE GLYCOL 3350 17 G PO PACK: 17.0000 g | PACK | Freq: Every day | ORAL | Status: DC

## 2015-01-09 MED ORDER — ONDANSETRON 4 MG PO TBDP: 4.0000 mg | ORAL_TABLET | Freq: Three times a day (TID) | ORAL | Status: DC | PRN

## 2015-01-09 MED ORDER — CLINDAMYCIN HCL 300 MG PO CAPS: 300.0000 mg | ORAL_CAPSULE | Freq: Three times a day (TID) | ORAL | Status: DC

## 2015-01-09 MED ORDER — METRONIDAZOLE 500 MG PO TABS: 500.0000 mg | ORAL_TABLET | Freq: Three times a day (TID) | ORAL | Status: DC

## 2015-01-09 MED ORDER — WARFARIN SODIUM 5 MG PO TABS: 5.0000 mg | ORAL_TABLET | Freq: Once | ORAL | Status: DC

## 2015-01-09 NOTE — Progress Notes (Signed)
  Subjective: Feels fine  Objective: Vital signs in last 24 hours: Temp:  [98.4 F (36.9 C)-101.7 F (38.7 C)] 98.4 F (36.9 C) (10/29 0420) Pulse Rate:  [76-79] 79 (10/29 0420) Resp:  [18] 18 (10/29 0420) BP: (111-134)/(60-80) 134/80 mmHg (10/29 0420) SpO2:  [98 %-100 %] 100 % (10/29 0420) Last BM Date: 01/05/15 (pta)  Intake/Output from previous day: 10/28 0701 - 10/29 0700 In: 1140 [P.O.:1140] Out: 2150 [Urine:2150] Intake/Output this shift:    no abscess palpable, no drainage, no erythema  Lab Results:   Recent Labs  01/07/15 0402 01/08/15 0420  WBC 9.4 11.0*  HGB 12.3* 12.0*  HCT 37.4* 37.0*  PLT 204 213   BMET  Recent Labs  01/07/15 0402 01/08/15 0420  NA 136 139  K 3.7 3.8  CL 105 105  CO2 26 28  GLUCOSE 92 168*  BUN 16 15  CREATININE 1.34* 1.51*  CALCIUM 8.1* 8.6*   PT/INR  Recent Labs  01/08/15 0420 01/09/15 0405  LABPROT 27.0* 22.6*  INR 2.54* 2.00*   ABG No results for input(s): PHART, HCO3 in the last 72 hours.  Invalid input(s): PCO2, PO2  Studies/Results: No results found.  Anti-infectives: Anti-infectives    Start     Dose/Rate Route Frequency Ordered Stop   01/09/15 0615  metroNIDAZOLE (FLAGYL) tablet 500 mg     500 mg Oral  Once 01/09/15 0600 01/09/15 0623   01/07/15 1400  metroNIDAZOLE (FLAGYL) IVPB 500 mg     500 mg 100 mL/hr over 60 Minutes Intravenous 3 times per day 01/07/15 0305     01/07/15 1000  ciprofloxacin (CIPRO) IVPB 400 mg     400 mg 200 mL/hr over 60 Minutes Intravenous Every 12 hours 01/07/15 0317     01/07/15 0100  ciprofloxacin (CIPRO) IVPB 400 mg     400 mg 200 mL/hr over 60 Minutes Intravenous  Once 01/07/15 0059 01/07/15 0303   01/07/15 0100  metroNIDAZOLE (FLAGYL) IVPB 500 mg     500 mg 100 mL/hr over 60 Minutes Intravenous  Once 01/07/15 0059 01/07/15 0433   01/06/15 2315  vancomycin (VANCOCIN) 1,250 mg in sodium chloride 0.9 % 250 mL IVPB     1,250 mg 166.7 mL/hr over 90 Minutes  Intravenous  Once 01/06/15 2319 01/07/15 0203      Assessment/Plan: Perirectal abscess  Temp overnight but I dont see anything on his exam today.  Think for abscess reasonable to send home on abx. He knows this certainly can recur but appears to have drained on own  [facility] 01/09/2015
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
## 2015-01-09 NOTE — Discharge Summary (Signed)
Physician Discharge Summary  Glenn Logan MCN:[id number] DOB: March 16, 1945 DOA: 01/06/2015  PCP: Tommy Medal, MD  Admit date: 01/06/2015 Discharge date: 01/09/2015  Time spent: 35 minutes  Recommendations for Outpatient Follow-up:  1. Please follow up on perirectal abscess, he was discharged on clindamycin.  2. Follow up on PT/INR on hospital follow up  Discharge Diagnoses:  Active Problems:   Perirectal abscess   DM (diabetes mellitus), type 2 with renal complications (HCC)   Essential hypertension   History of DVT (deep vein thrombosis)   Discharge Condition: Stable  Diet recommendation: Carb Modified  Filed Weights   01/07/15 0009 01/07/15 0258  Weight: 61.236 kg (135 lb) 63.4 kg (139 lb 12.4 oz)    History of present illness:  Glenn Logan is a 69 y.o. male history of prostate cancer would has had prostate surgery in August 2016 presents to the ER because of worsening perirectal pain over the last 2 weeks. Patient also started developing fever and chills. In the ER patient was found to be febrile and CT abdomen and pelvis shows perirectal abscess. On-call surgeon Dr. Brantley Stage was consulted and the standard collar is advised to keep patient on IV antibiotics and managed conservatively. On my exam patient is not in any distress and patient's pain has improved with pain relief medications. Patient denies any nausea vomiting or diarrhea.  Hospital Course:  Glenn Logan is a 69 y. O. Male with a history of DVT, hypertension and prostate cancer who presented to the ER with 2 weeks of worsening perirectal pain. Patient had also developed fever and chills. CT abdomen/pelvis showed a small perirectal abscess. Surgery was consulted and advised continuation of IV antibiotics and conservative management at this time. Patient has improved overnight and is in no acute distress on exam. Patient is constipated and cannot remember the last time he had a bowel movement, he denies  nausea, vomiting, diarrhea or abdominal pain.  Perirectal Abscess:  -Patient presented with left swollen and painful buttock, worsening x 2 weeks and fever of 100.5. CT shows a very small abscess within the left perirectal space. Patient on Coumadin for history of blood clots.  -Patient expressing interest in non-operative management -On 01/09/2015 he was evaluated by Dr Donne Hazel of Surgery, felt he was stable for discharge on oral antibiotic therapy.  -Patient discharged on clindamycin 300 mg PO TID  Active problems History of recurrent DVT:  -INR 2.0 on day of discharge.  -Please follow up on PT/INR  Diabetes mellitus type 2-  -Will discharge on gyburide  Hypertension:  -Blood pressures are stable, having systolic blood pressures Flexeril between 120s and 140s -Continue losartan 50 mg by mouth daily  Constipation:  -Miralax ordered.  Chronic kidney disease stage III  - Patient having a baseline creatinine near 1.4.    Consultations:  General Surgery  Discharge Exam: Filed Vitals:   01/09/15 0420  BP: 134/80  Pulse: 79  Temp: 98.4 F (36.9 C)  Resp: 18     General: Moderately built and nourished, reports feeling better he is awake and alert oriented, nontoxic-appearing  ENT: No discharge from the ears eyes nose and mouth.  Cardiovascular: S1-S2 heard.  Respiratory: No rhonchi or crepitations. Normal effort.  Abdomen: Soft, Non-tender, non-distended, normoactive bowel sounds present.  Skin: No rash.  Musculoskeletal: No edema.  Psychiatric: Appears normal.  Neurologic: Alert awake oriented to time place and person. Moves all extremities  Discharge Instructions   Discharge Instructions    Call MD for:  difficulty breathing,  headache or visual disturbances    Complete by:  As directed      Call MD for:  extreme fatigue    Complete by:  As directed      Call MD for:  hives    Complete by:  As directed      Call MD for:  persistant dizziness  or light-headedness    Complete by:  As directed      Call MD for:  persistant nausea and vomiting    Complete by:  As directed      Call MD for:  redness, tenderness, or signs of infection (pain, swelling, redness, odor or green/yellow discharge around incision site)    Complete by:  As directed      Call MD for:  severe uncontrolled pain    Complete by:  As directed      Call MD for:  temperature >100.4    Complete by:  As directed      Call MD for:    Complete by:  As directed      Diet - low sodium heart healthy    Complete by:  As directed      Increase activity slowly    Complete by:  As directed           Current Discharge Medication List    START taking these medications   Details  clindamycin (CLEOCIN) 300 MG capsule Take 1 capsule (300 mg total) by mouth 3 (three) times daily. Qty: 15 capsule, Refills: 0    ondansetron (ZOFRAN ODT) 4 MG disintegrating tablet Take 1 tablet (4 mg total) by mouth every 8 (eight) hours as needed for nausea or vomiting. Qty: 20 tablet, Refills: 0    oxyCODONE (OXY IR/ROXICODONE) 5 MG immediate release tablet Take 1 tablet (5 mg total) by mouth every 6 (six) hours as needed for severe pain. Qty: 12 tablet, Refills: 0    polyethylene glycol (MIRALAX / GLYCOLAX) packet Take 17 g by mouth daily. Qty: 14 each, Refills: 0      CONTINUE these medications which have NOT CHANGED   Details  albuterol (ACCUNEB) 0.63 MG/3ML nebulizer solution Take 1 ampule by nebulization 2 (two) times daily.    cetirizine (ZYRTEC) 10 MG tablet Take 10 mg by mouth daily.    docusate sodium (COLACE) 100 MG capsule Take 100 mg by mouth 3 (three) times daily.    finasteride (PROSCAR) 5 MG tablet Take 5 mg by mouth daily.    fluticasone (FLONASE) 50 MCG/ACT nasal spray Place 2 sprays into both nostrils daily.    glyBURIDE (DIABETA) 1.25 MG tablet Take 1.25 mg by mouth daily with breakfast.    losartan (COZAAR) 100 MG tablet Take 50 mg by mouth daily.     Multiple Vitamin (MULTIVITAMIN WITH MINERALS) TABS tablet Take 1 tablet by mouth daily.    warfarin (COUMADIN) 5 MG tablet Take 5-7.5 mg by mouth daily. Take 1 tablet (5mg ) daily except Tuesday, Thursday, and Sat. Take 1&1/2 tab (7.5 mg)    zolpidem (AMBIEN) 10 MG tablet Take 5 mg by mouth at bedtime as needed for sleep.    traMADol (ULTRAM) 50 MG tablet Take 1 tablet (50 mg total) by mouth every 8 (eight) hours as needed. Qty: 15 tablet, Refills: 0      STOP taking these medications     predniSONE (STERAPRED UNI-PAK) 10 MG tablet        Allergies  Allergen Reactions  . Penicillins Anaphylaxis  Has patient had a PCN reaction causing immediate rash, facial/tongue/throat swelling, SOB or lightheadedness with hypotension: Yes Has patient had a PCN reaction causing severe rash involving mucus membranes or skin necrosis: Yes Has patient had a PCN reaction that required hospitalization No Has patient had a PCN reaction occurring within the last 10 years: Yes If all of the above answers are "NO", then may proceed with Cephalosporin use.   . Sulfa Antibiotics Swelling   Follow-up Information    Follow up with PANG,RICHARD, MD In 2 weeks.   Specialty:  Internal Medicine   Contact information:   [address], [place] [address] [phone]        The results of significant diagnostics from this hospitalization (including imaging, microbiology, ancillary and laboratory) are listed below for reference.    Significant Diagnostic Studies: Ct Abdomen Pelvis W Contrast  01/07/2015  CLINICAL DATA:  Acute onset of left buttock swelling and pain. Initial encounter. EXAM: CT ABDOMEN AND PELVIS WITH CONTRAST TECHNIQUE: Multidetector CT imaging of the abdomen and pelvis was performed using the standard protocol following bolus administration of intravenous contrast. CONTRAST:  147mL OMNIPAQUE IOHEXOL 300 MG/ML  SOLN COMPARISON:  None. FINDINGS: The visualized lung bases  are clear. Scattered hepatic cysts measure up to 2.6 cm in size. The spleen is unremarkable in appearance. The gallbladder is within normal limits. The pancreas and adrenal glands are unremarkable. A 1.1 cm renal cyst is noted at the upper pole of the right kidney. A 4 mm stone is noted at the interpole region of the left kidney. Mild nonspecific perinephric stranding is noted bilaterally. There is no evidence of hydronephrosis. No obstructing ureteral stones are identified. No free fluid is identified. The small bowel is unremarkable in appearance. The stomach is within normal limits. No acute vascular abnormalities are seen. The appendix is normal in caliber and contains air, without evidence of appendicitis. Scattered diverticulosis is noted along the descending and proximal sigmoid colon, without evidence of diverticulitis. Postoperative change is noted along the midline anterior abdominal wall. The bladder is mildly distended and grossly unremarkable in appearance. The patient is status post prostatectomy. Mild surrounding postoperative stranding is noted. No inguinal lymphadenopathy is seen. A small peripherally enhancing abscess is noted posterior to the anorectal canal, measuring 2.2 x 2.0 cm, with surrounding soft tissue inflammation. Soft tissue inflammation extends superiorly about the rectum, and there is a 2.4 x 0.8 cm fluid collection along the left pelvic sidewall. No acute osseous abnormalities are identified. IMPRESSION: 1. Small peripherally enhancing abscess noted posterior to the anorectal canal, measuring 2.2 x 2.0 cm, with surrounding soft tissue inflammation. Soft tissue inflammation extends superiorly about the rectum. 2.4 x 0.8 cm fluid collection noted along the left pelvic sidewall, which may reflect a small postoperative seroma or possibly additional abscess. 2. Postoperative soft tissue stranding noted about the prostate bed. 3. Small right renal cyst noted. 4 mm nonobstructing stone at  the interpole region of the left kidney. 4. Scattered hepatic cysts noted. 5. Scattered diverticulosis along the descending and proximal sigmoid colon, without evidence of diverticulitis. Electronically Signed   By: Garald Balding [name]   On: 01/07/2015 00:50    Microbiology: No results found for this or any previous visit (from the past 240 hour(s)).   Labs: Basic Metabolic Panel:  Recent Labs Lab 01/06/15 2324 01/06/15 2336 01/07/15 0402 01/08/15 0420  NA 140  --  136 139  K 3.9  --  3.7 3.8  CL 106  --  105 105  CO2 28  --  26 28  GLUCOSE 105*  --  92 168*  BUN 18  --  16 15  CREATININE 1.46* 1.60* 1.34* 1.51*  CALCIUM 8.9  --  8.1* 8.6*   Liver Function Tests:  Recent Labs Lab 01/07/15 0402  AST 13*  ALT 12*  ALKPHOS 38  BILITOT 1.0  PROT 6.3*  ALBUMIN 3.2*   No results for input(s): LIPASE, AMYLASE in the last 168 hours. No results for input(s): AMMONIA in the last 168 hours. CBC:  Recent Labs Lab 01/06/15 2324 01/07/15 0402 01/08/15 0420  WBC 9.3 9.4 11.0*  NEUTROABS  --  6.4  --   HGB 13.0 12.3* 12.0*  HCT 39.5 37.4* 37.0*  MCV 91.9 91.2 91.4  PLT 223 204 213   Cardiac Enzymes: No results for input(s): CKTOTAL, CKMB, CKMBINDEX, TROPONINI in the last 168 hours. BNP: BNP (last 3 results) No results for input(s): BNP in the last 8760 hours.  ProBNP (last 3 results) No results for input(s): PROBNP in the last 8760 hours.  CBG:  Recent Labs Lab 01/08/15 2006 01/09/15 0015 01/09/15 0416 01/09/15 0732 01/09/15 1209  GLUCAP 86 106* 93 95 122*       Signed:  Charina Fons  [facility] 01/09/2015, 12:23 PM

## 2015-02-12 ENCOUNTER — Other Ambulatory Visit: Payer: Self-pay

## 2015-02-12 NOTE — Patient Outreach (Signed)
[facility]) Care Management  02/12/2015  [name] 01/05/1946 [id number]  Telephone call to patient regarding [facility] referral.  Unable to reach patient. HIPAA compliant voice message left with call back phone number.   PLAN: RNCM will attempt 2nd telephone outreach to patient within 1 week.   Quinn Plowman RN,BSN,CCM [place] Coordinator [phone]

## 2015-02-15 ENCOUNTER — Other Ambulatory Visit: Payer: Self-pay

## 2015-02-15 NOTE — Patient Outreach (Signed)
[place] [facility]) Care Management  02/15/2015  MAYA ORIA 1945-04-06 [id number]  Second telephone call to patient regarding [facility] referral.  Unable to reach patient.  HIPAA compliant voice message left with call back phone number.   PLAN: RNCM will attempt 3rd telephone outreach within 1 week.   Quinn Plowman RN,BSN,CCM [place] Coordinator [phone]'

## 2015-02-18 ENCOUNTER — Other Ambulatory Visit: Payer: Self-pay

## 2015-02-18 DIAGNOSIS — E0829 Diabetes mellitus due to underlying condition with other diabetic kidney complication: Secondary | ICD-10-CM

## 2015-02-18 DIAGNOSIS — Z79899 Other long term (current) drug therapy: Secondary | ICD-10-CM

## 2015-02-19 ENCOUNTER — Ambulatory Visit: Payer: Self-pay

## 2015-02-19 NOTE — Patient Outreach (Addendum)
[place] [facility]) Care Management  02/19/2015  VONG BARGER June 17, 1945 [id number]  SUBJECTIVE: Telephone call to patient regarding [facility] referral. HIPAA verified with patient. Discussed and offered [facility] care management services to patient. Patient verbally agreed to receive services.   HOSPITALIZATION: Patient was previously hospitalized from 01/06/15 to 01/09/15 for peri rectal abscess.  Has followed up recently with surgeon.  Patient states doctor states he is healing well.  Patient unable to state name of doctor.   MEDICATION: Patient states he gets his medications from the [place].  Patient states he has difficulty reading his medications and understanding what they are for. Patient states he is unsure of the medication names.    DIABETES Type 2: Patient states he takes oral medication for his diabetes.  Patient states he does not know how to check his blood sugars and has not had any diabetic education.  Patient states he goes to the [place] clinic in [place], [place]. Patient states he usually goes every 3 months or so.  Patient states he sees 3 doctors at the [place]. Patient state he sees a Dr. Oletta Lamas at the clinic.  Patient unable to state the other doctors he sees at clinic.   SOCIAL SUPPORT: Patient states he has a male friend that checks on him every day.  Patient states he is still active. Patient states he is presently remodeling his house, plays golf and pool.   ASSESSMENT: [facility] referral for self management of medication with occasional non compliance. Patient not checking blood sugars at home. Patient has some difficulty with articulating medical history.   PLAN: RNCM will refer patient to community case manager and pharmacy.  Quinn Plowman RN,BSN,CCM [place] Coordinator [phone]

## 2015-02-23 ENCOUNTER — Other Ambulatory Visit: Payer: Self-pay | Admitting: *Deleted

## 2015-02-23 NOTE — Patient Outreach (Signed)
[place] [facility]) Care Management  02/23/2015  Glenn Logan [birth year] [id number]  RN attempted outreach call to pt today however unsuccessful. RN able to leave a HIPAA approved voice message requesting a call back. Will inquire further at that time on needed interventions and services.  Raina Mina, RN Care Management Coordinator [facility] Main Office [phone]

## 2015-02-26 ENCOUNTER — Other Ambulatory Visit: Payer: Self-pay | Admitting: Pharmacist

## 2015-02-26 NOTE — Patient Outreach (Signed)
[facility]) Care Management  02/26/2015  FRANCOIS OLIVA 1945-05-22 [id number]   Glenn Logan is a 69yo referred to [place] for medication management.  I called Glenn Logan and set up an initial home visit for Monday, December 19th, 2016 at 1:00 PM.    Elisabeth Most, Pharm.D. Pharmacy Resident [place] [phone]

## 2015-03-01 ENCOUNTER — Other Ambulatory Visit: Payer: Self-pay | Admitting: Pharmacist

## 2015-03-01 ENCOUNTER — Other Ambulatory Visit: Payer: Self-pay | Admitting: *Deleted

## 2015-03-01 NOTE — Patient Outreach (Signed)
[name]) Care Management  [place]   03/01/2015  OLLIVANDER Logan 17-Nov-1945 [id number]  Subjective: Glenn Logan is a 69yo referred to [name] for medication management.  Per [name] screening, patient stated he has difficulty reading his medications and understanding what they are for.  Patient stated he was unsure of the medication names.    I made initial home visit today.  Joint home visit was made with [name].  Patient had all medications on his coffee table.  Patient currently gets his prescriptions filled at the [name].  I reviewed all medications with the patient and discussed the purpose, proper use, and adverse effects of all medications.  I had patient read medication instructions to me and patient confirms he is able to read the medication label.  During review of medications, patient appears to have a good understanding of what his medications are for.  Patient reports his confusion regarding his medication occurs when medications are dispensed under the generic name and he knows them by their brand name.  I printed patient a list of his medications that included both brand and generic names.  Patient was previously on glyburide for diabetes.  Patient reports glyburide was discontinued by his primary care provider, [name].  Patient showed paperwork from primary care visit on 02/18/15 that confirms instructions to stop glyburide as A1c was 5.7%.    Patient is currently on warfarin for history of DVT.  Patient reports his INR is monitored at the [place].  He reports having his INR monitored approximately one week ago and reports most recent INR was therapeutic (2.0).  Patient reports current warfarin dosing regimen is 5mg  daily except 7.5mg  on Tuesday, Thursday, and Saturday.  Counseled patient on signs and symptoms of bleeding with warfarin and importance of contacting provider if bleeding occurs.  Patient voices  understanding and denies signs or symptoms of bleeding.      Discussed medication adherence.  Patient reports adherence with medications but admits to occasionally forgetting to take his medications.  Patient is not currently using a pill box.  I offered patient a pill box and patient accepted.  I offered to teach patient how to fill pill box and assist in filling pill box for the next week, but patient declined and reports he knows how to fill pill box.      Objective:   Current Medications: Current Outpatient Prescriptions  Medication Sig Dispense Refill  . albuterol (ACCUNEB) 0.63 MG/3ML nebulizer solution Take 1 ampule by nebulization 2 (two) times daily.    . budesonide-formoterol (SYMBICORT) 160-4.5 MCG/ACT inhaler Inhale 2 puffs into the lungs 2 (two) times daily.    . cetirizine (ZYRTEC) 10 MG tablet Take 10 mg by mouth daily.    . finasteride (PROSCAR) 5 MG tablet Take 5 mg by mouth daily.    . fluticasone (FLONASE) 50 MCG/ACT nasal spray Place 2 sprays into both nostrils daily.    [name] losartan (COZAAR) 100 MG tablet Take 50 mg by mouth daily.    . Multiple Vitamin (MULTIVITAMIN WITH MINERALS) TABS tablet Take 1 tablet by mouth daily.    [name] warfarin (COUMADIN) 5 MG tablet Take 5-7.5 mg by mouth daily. Take 1 tablet (5mg ) daily except Tuesday, Thursday, and Sat. Take 1&1/2 tab (7.5 mg)    . zolpidem (AMBIEN) 10 MG tablet Take 5 mg by mouth at bedtime as needed for sleep.    [name] docusate sodium (COLACE) 100 MG capsule Take  100 mg by mouth 3 (three) times daily. Reported on 03/01/2015    . ondansetron (ZOFRAN ODT) 4 MG disintegrating tablet Take 1 tablet (4 mg total) by mouth every 8 (eight) hours as needed for nausea or vomiting. (Patient not taking: Reported on 03/01/2015) 20 tablet 0  . polyethylene glycol (MIRALAX / GLYCOLAX) packet Take 17 g by mouth daily. (Patient not taking: Reported on 03/01/2015) 14 each 0   No current facility-administered medications for this visit.   Functional  Status: In your present state of health, do you have any difficulty performing the following activities: 03/01/2015 01/07/2015  Hearing? N N  Vision? N N  Difficulty concentrating or making decisions? N N  Walking or climbing stairs? N N  Dressing or bathing? N N  Doing errands, shopping? N Y  Conservation officer, nature and eating ? N -  In the past six months, have you accidently leaked urine? N -  Do you have problems with loss of bowel control? N -  Managing your Medications? N -  Managing your Finances? N -  Housekeeping or managing your Housekeeping? N -   Fall/Depression Screening: PHQ 2/9 Scores 03/01/2015  PHQ - 2 Score 1    Assessment: 1.  Medication review:   Drugs sorted by system:  Neurologic/Psychologic: none  Cardiovascular: losartan, warfarin  Pulmonary/Allergy: albuterol, budesonide-formoterol, cetirizine, fluticasone nasal spray  Gastrointestinal: docusate (not taking), ondansetron (not taking), polyethylene glycol (not taking)   Endocrine: none  Renal: none   Topical: none  Pain: none  Vitamins/Minerals: multivitamin  Infectious Diseases: none  Miscellaneous: finasteride, zolpidem   Duplications in therapy: none noted Gaps in therapy: none noted  Medications to avoid in the elderly: zolpidem (increased risk of delirium, falls, fractures) Drug interactions: none noted Other issues noted: patient reports he is currently taking Symbicort PRN   2.  Medication adherence:  Patient admits occasional non-adherence to prescription medications due to forgetting to take medications.  Patient is currently taking his prescription medications out of the pill bottle and not using a pill box.     Plan: 1.  Medication review:  All medications appear appropriate based on patient's problem list.  Would use caution with medications on the beers list.   2.  Medication adherence:  Patient reports he is taking Symbicort PRN.  Counseled patient on purpose, proper use, and  adverse effects of his inhalers including difference between maintenance versus rescue inhalers and importance of rinsing mouth after Symbicort use.  Provided patient with a pill box to organize his medications.  Patient declined assistance with filling pill box.  I offered to schedule follow up home visit regarding medication adherence; however, patient declined.   3.  I provided patient with a list of his medications including both brand and generic name and reviewed purpose, proper use, and adverse effects of all medications.  Encouraged patient to take medications daily as prescribed.   4.  Will close pharmacy program at this time as patient declined further pharmacy services.  I have alerted [facility] CMRN Raina Mina.  I provided patient with my phone number and told him to call me if he is interested in pharmacy services in the future.     Elisabeth Most, Pharm.D. Pharmacy Resident [place] [phone]

## 2015-03-01 NOTE — Patient Outreach (Signed)
[facility]) Care Management   03/01/2015  Glenn Logan 06/01/1945 [id number]  Glenn Logan is an 69 y.o. male  Subjective:  THN: Pt receptive to enrollment in to the [facility] program and services.  DM: Pt states he needs education concerning his diabetes and what to do if acute symptoms occur. Pt states provided and states his last A1C 5.7 and he is no long on his diabetic medications. Pt has requested information on this topic to further prevent  Issues with his diabetes.  MEDICATION: Pt states he did not taken any of his medications today including his BP medication. Pt states his BP maybe elevated due to he has not taken his BP medication. Pt denies any symptoms of hyper-hypotension and aware of what to do if acute. Pt aware not to double up on any medications if close to the time to take the second dosage of the same medication.   Overall pt feels his is able to manage his care independently and verified he has been informed not to take his diabetic medication any longer due to his normal A1C however would prefer to have some information for few information on diabetes. Pt feels his BP Is under control with no high levels and no signs or symptoms experienced.  No other problems or request at this time.   Objective:   Review of Systems  Constitutional: Negative.   HENT: Negative.   Eyes: Negative.   Respiratory: Negative.   Cardiovascular: Negative.   Gastrointestinal: Negative.   Genitourinary: Negative.   Musculoskeletal: Negative.   Skin: Negative.   Neurological: Negative.   Endo/Heme/Allergies: Negative.   Psychiatric/Behavioral: Positive for depression.       History of occasion depression as pt declined counseling via [facility] Education officer, museum.    Physical Exam  Constitutional: He is oriented to person, place, and time. He appears well-developed and well-nourished.  HENT:  Right Ear: External ear normal.  Left Ear: External ear normal.  Neck: Normal  range of motion.  Cardiovascular: Normal heart sounds.   Respiratory: Effort normal and breath sounds normal.  GI: Soft. Bowel sounds are normal.  Musculoskeletal: Normal range of motion.  Neurological: He is alert and oriented to person, place, and time.  Skin: Skin is warm and dry.  Psychiatric: He has a normal mood and affect. His behavior is normal. Judgment normal.  Slightly delayed    Current Medications:   Current Outpatient Prescriptions  Medication Sig Dispense Refill  . albuterol (ACCUNEB) 0.63 MG/3ML nebulizer solution Take 1 ampule by nebulization 2 (two) times daily.    . budesonide-formoterol (SYMBICORT) 160-4.5 MCG/ACT inhaler Inhale 2 puffs into the lungs 2 (two) times daily.    . cetirizine (ZYRTEC) 10 MG tablet Take 10 mg by mouth daily.    [name] docusate sodium (COLACE) 100 MG capsule Take 100 mg by mouth 3 (three) times daily. Reported on 03/01/2015    . finasteride (PROSCAR) 5 MG tablet Take 5 mg by mouth daily.    . fluticasone (FLONASE) 50 MCG/ACT nasal spray Place 2 sprays into both nostrils daily.    [name] losartan (COZAAR) 100 MG tablet Take 50 mg by mouth daily.    . Multiple Vitamin (MULTIVITAMIN WITH MINERALS) TABS tablet Take 1 tablet by mouth daily.    . ondansetron (ZOFRAN ODT) 4 MG disintegrating tablet Take 1 tablet (4 mg total) by mouth every 8 (eight) hours as needed for nausea or vomiting. (Patient not taking: Reported on 03/01/2015) 20 tablet 0  .  polyethylene glycol (MIRALAX / GLYCOLAX) packet Take 17 g by mouth daily. (Patient not taking: Reported on 03/01/2015) 14 each 0  . warfarin (COUMADIN) 5 MG tablet Take 5-7.5 mg by mouth daily. Take 1 tablet (5mg ) daily except Tuesday, Thursday, and Sat. Take 1&1/2 tab (7.5 mg)    . zolpidem (AMBIEN) 10 MG tablet Take 5 mg by mouth at bedtime as needed for sleep.     No current facility-administered medications for this visit.    Functional Status:   In your present state of health, do you have any difficulty  performing the following activities: 01/07/2015  Hearing? N  Vision? N  Difficulty concentrating or making decisions? N  Walking or climbing stairs? N  Dressing or bathing? N  Doing errands, shopping? Glenn Logan Vitals:   03/01/15 1426  BP: 144/88  Pulse: 56  Resp: 20    Fall/Depression Screening:    No flowsheet data found.  Assessment:   Educate on [employer] and obtained a signed consent Lack of education related to Diabetes Non-adherence related to prescription medications  Plan:  Physical assessment completed after consent form obtained for community services related to case management services.  Will provide printable material on diabetes as requested for pt's knowledge. Will discussed all material in detail and provide calendar tools for pt to documenting his readings for his providers to view if needed. Will educate pt on diabetes and how this affects his HTN. Will provide [facility] printable material (DM: Preventing Heart Attack and Stroke, DM: Why check your Blood Sugar, DM and BP and a Chart for Low Salt [employer]).  RN also provided pt with a [facility] calendar and review this material for documenting all his readings related to HTN and diabetic readings if ever need ( verified pt currently has a normal A1C at 5.7 and no long on diabetic medication).  Will strongly encouraged pt to take his daily medications to avoid acute events. Will discussed the importance of taking his medication in a timely manner to avoid acute episodes in regulating his symptoms related to his medical issues.  No all medication issues and education perform today in the presence of a [place] with pt's verbalizing an understanding. Pt was also provided a pill box to organize his medications. PT offered assistance to fill however pt has declined.   Plan of care discussed with goals in place for pt to improve on with managing his care. Will schedule next month's visit and provide contact number if pt needs RN prior to the  next appointment. Will notify pt's primary of pt's involved with [facility] services.  Raina Mina, RN Care Management Coordinator [place] [facility] Main Office [phone]

## 2015-04-01 ENCOUNTER — Emergency Department (HOSPITAL_COMMUNITY)
Admission: EM | Admit: 2015-04-01 | Discharge: 2015-04-01 | Disposition: A | Discharge status: Home / Self Care | Payer: PPO | Attending: Emergency Medicine | Admitting: Emergency Medicine

## 2015-04-01 ENCOUNTER — Ambulatory Visit: Payer: Self-pay | Admitting: *Deleted

## 2015-04-01 ENCOUNTER — Encounter (HOSPITAL_COMMUNITY): Payer: Self-pay | Admitting: *Deleted

## 2015-04-01 DIAGNOSIS — Z8719 Personal history of other diseases of the digestive system: Secondary | ICD-10-CM | POA: Insufficient documentation

## 2015-04-01 DIAGNOSIS — Z8709 Personal history of other diseases of the respiratory system: Secondary | ICD-10-CM | POA: Diagnosis not present

## 2015-04-01 DIAGNOSIS — Z87891 Personal history of nicotine dependence: Secondary | ICD-10-CM | POA: Diagnosis not present

## 2015-04-01 DIAGNOSIS — S199XXA Unspecified injury of neck, initial encounter: Secondary | ICD-10-CM | POA: Insufficient documentation

## 2015-04-01 DIAGNOSIS — Z7951 Long term (current) use of inhaled steroids: Secondary | ICD-10-CM | POA: Insufficient documentation

## 2015-04-01 DIAGNOSIS — Y998 Other external cause status: Secondary | ICD-10-CM | POA: Insufficient documentation

## 2015-04-01 DIAGNOSIS — M542 Cervicalgia: Secondary | ICD-10-CM | POA: Diagnosis not present

## 2015-04-01 DIAGNOSIS — I10 Essential (primary) hypertension: Secondary | ICD-10-CM | POA: Diagnosis not present

## 2015-04-01 DIAGNOSIS — Z85038 Personal history of other malignant neoplasm of large intestine: Secondary | ICD-10-CM | POA: Insufficient documentation

## 2015-04-01 DIAGNOSIS — Z8739 Personal history of other diseases of the musculoskeletal system and connective tissue: Secondary | ICD-10-CM | POA: Insufficient documentation

## 2015-04-01 DIAGNOSIS — Z79899 Other long term (current) drug therapy: Secondary | ICD-10-CM | POA: Diagnosis not present

## 2015-04-01 DIAGNOSIS — Z86718 Personal history of other venous thrombosis and embolism: Secondary | ICD-10-CM | POA: Insufficient documentation

## 2015-04-01 DIAGNOSIS — Z7901 Long term (current) use of anticoagulants: Secondary | ICD-10-CM | POA: Diagnosis not present

## 2015-04-01 DIAGNOSIS — Z8659 Personal history of other mental and behavioral disorders: Secondary | ICD-10-CM | POA: Insufficient documentation

## 2015-04-01 DIAGNOSIS — M545 Low back pain: Secondary | ICD-10-CM

## 2015-04-01 DIAGNOSIS — S3992XA Unspecified injury of lower back, initial encounter: Secondary | ICD-10-CM | POA: Insufficient documentation

## 2015-04-01 DIAGNOSIS — Z880 Allergy status to penicillin: Secondary | ICD-10-CM | POA: Insufficient documentation

## 2015-04-01 DIAGNOSIS — Z87828 Personal history of other (healed) physical injury and trauma: Secondary | ICD-10-CM | POA: Insufficient documentation

## 2015-04-01 DIAGNOSIS — Y9389 Activity, other specified: Secondary | ICD-10-CM | POA: Diagnosis not present

## 2015-04-01 DIAGNOSIS — E119 Type 2 diabetes mellitus without complications: Secondary | ICD-10-CM | POA: Diagnosis not present

## 2015-04-01 DIAGNOSIS — Z9889 Other specified postprocedural states: Secondary | ICD-10-CM | POA: Insufficient documentation

## 2015-04-01 DIAGNOSIS — Y92410 Unspecified street and highway as the place of occurrence of the external cause: Secondary | ICD-10-CM | POA: Insufficient documentation

## 2015-04-01 MED ORDER — METHOCARBAMOL 500 MG PO TABS: 500.0000 mg | ORAL_TABLET | Freq: Four times a day (QID) | ORAL | Status: DC
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
## 2015-04-01 NOTE — Discharge Instructions (Signed)
Back Pain, Adult °Back pain is very common in adults. The cause of back pain is rarely dangerous and the pain often gets better over time. The cause of your back pain may not be known. Some common causes of back pain include: °· Strain of the muscles or ligaments supporting the spine. °· Wear and tear (degeneration) of the spinal disks. °· Arthritis. °· Direct injury to the back. °For many people, back pain may return. Since back pain is rarely dangerous, most people can learn to manage this condition on their own. °HOME CARE INSTRUCTIONS °Watch your back pain for any changes. The following actions may help to lessen any discomfort you are feeling: °· Remain active. It is stressful on your back to sit or stand in one place for long periods of time. Do not sit, drive, or stand in one place for more than 30 minutes at a time. Take short walks on even surfaces as soon as you are able. Try to increase the length of time you walk each day. °· Exercise regularly as directed by your health care provider. Exercise helps your back heal faster. It also helps avoid future injury by keeping your muscles strong and flexible. °· Do not stay in bed. Resting more than 1-2 days can delay your recovery. °· Pay attention to your body when you bend and lift. The most comfortable positions are those that put less stress on your recovering back. Always use proper lifting techniques, including: °· Bending your knees. °· Keeping the load close to your body. °· Avoiding twisting. °· Find a comfortable position to sleep. Use a firm mattress and lie on your side with your knees slightly bent. If you lie on your back, put a pillow under your knees. °· Avoid feeling anxious or stressed. Stress increases muscle tension and can worsen back pain. It is important to recognize when you are anxious or stressed and learn ways to manage it, such as with exercise. °· Take medicines only as directed by your health care provider. Over-the-counter  medicines to reduce pain and inflammation are often the most helpful. Your health care provider may prescribe muscle relaxant drugs. These medicines help dull your pain so you can more quickly return to your normal activities and healthy exercise. °· Apply ice to the injured area: °· Put ice in a plastic bag. °· Place a towel between your skin and the bag. °· Leave the ice on for 20 minutes, 2-3 times a day for the first 2-3 days. After that, ice and heat may be alternated to reduce pain and spasms. °· Maintain a healthy weight. Excess weight puts extra stress on your back and makes it difficult to maintain good posture. °SEEK MEDICAL CARE IF: °· You have pain that is not relieved with rest or medicine. °· You have increasing pain going down into the legs or buttocks. °· You have pain that does not improve in one week. °· You have night pain. °· You lose weight. °· You have a fever or chills. °SEEK IMMEDIATE MEDICAL CARE IF:  °· You develop new bowel or bladder control problems. °· You have unusual weakness or numbness in your arms or legs. °· You develop nausea or vomiting. °· You develop abdominal pain. °· You feel faint. °  °This information is not intended to replace advice given to you by your health care provider. Make sure you discuss any questions you have with your health care provider. °  °Document Released: 02/27/2005 Document Revised: 03/20/2014 Document Reviewed: 07/01/2013 °Elsevier Interactive Patient Education ©2016 Elsevier   Inc. Cervical Sprain A cervical sprain is an injury in the neck in which the strong, fibrous tissues (ligaments) that connect your neck bones stretch or tear. Cervical sprains can range from mild to severe. Severe cervical sprains can cause the neck vertebrae to be unstable. This can lead to damage of the spinal cord and can result in serious nervous system problems. The amount of time it takes for a cervical sprain to get better depends on the cause and extent of the injury.  Most cervical sprains heal in 1 to 3 weeks. CAUSES  Severe cervical sprains may be caused by:   Contact sport injuries (such as from football, rugby, wrestling, hockey, auto racing, gymnastics, diving, martial arts, or boxing).   Motor vehicle collisions.   Whiplash injuries. This is an injury from a sudden forward and backward whipping movement of the head and neck.  Falls.  Mild cervical sprains may be caused by:   Being in an awkward position, such as while cradling a telephone between your ear and shoulder.   Sitting in a chair that does not offer proper support.   Working at a poorly Landscape architect station.   Looking up or down for long periods of time.  SYMPTOMS   Pain, soreness, stiffness, or a burning sensation in the front, back, or sides of the neck. This discomfort may develop immediately after the injury or slowly, 24 hours or more after the injury.   Pain or tenderness directly in the middle of the back of the neck.   Shoulder or upper back pain.   Limited ability to move the neck.   Headache.   Dizziness.   Weakness, numbness, or tingling in the hands or arms.   Muscle spasms.   Difficulty swallowing or chewing.   Tenderness and swelling of the neck.  DIAGNOSIS  Most of the time your health care provider can diagnose a cervical sprain by taking your history and doing a physical exam. Your health care provider will ask about previous neck injuries and any known neck problems, such as arthritis in the neck. X-rays may be taken to find out if there are any other problems, such as with the bones of the neck. Other tests, such as a CT scan or MRI, may also be needed.  TREATMENT  Treatment depends on the severity of the cervical sprain. Mild sprains can be treated with rest, keeping the neck in place (immobilization), and pain medicines. Severe cervical sprains are immediately immobilized. Further treatment is done to help with pain, muscle  spasms, and other symptoms and may include:  Medicines, such as pain relievers, numbing medicines, or muscle relaxants.   Physical therapy. This may involve stretching exercises, strengthening exercises, and posture training. Exercises and improved posture can help stabilize the neck, strengthen muscles, and help stop symptoms from returning.  HOME CARE INSTRUCTIONS   Put ice on the injured area.   Put ice in a plastic bag.   Place a towel between your skin and the bag.   Leave the ice on for 15-20 minutes, 3-4 times a day.   If your injury was severe, you may have been given a cervical collar to wear. A cervical collar is a two-piece collar designed to keep your neck from moving while it heals.  Do not remove the collar unless instructed by your health care provider.  If you have long hair, keep it outside of the collar.  Ask your health care provider before making any adjustments to your  collar. Minor adjustments may be required over time to improve comfort and reduce pressure on your chin or on the back of your head.  Ifyou are allowed to remove the collar for cleaning or bathing, follow your health care provider's instructions on how to do so safely.  Keep your collar clean by wiping it with mild soap and water and drying it completely. If the collar you have been given includes removable pads, remove them every 1-2 days and hand wash them with soap and water. Allow them to air dry. They should be completely dry before you wear them in the collar.  If you are allowed to remove the collar for cleaning and bathing, wash and dry the skin of your neck. Check your skin for irritation or sores. If you see any, tell your health care provider.  Do not drive while wearing the collar.   Only take over-the-counter or prescription medicines for pain, discomfort, or fever as directed by your health care provider.   Keep all follow-up appointments as directed by your health care  provider.   Keep all physical therapy appointments as directed by your health care provider.   Make any needed adjustments to your workstation to promote good posture.   Avoid positions and activities that make your symptoms worse.   Warm up and stretch before being active to help prevent problems.  SEEK MEDICAL CARE IF:   Your pain is not controlled with medicine.   You are unable to decrease your pain medicine over time as planned.   Your activity level is not improving as expected.  SEEK IMMEDIATE MEDICAL CARE IF:   You develop any bleeding.  You develop stomach upset.  You have signs of an allergic reaction to your medicine.   Your symptoms get worse.   You develop new, unexplained symptoms.   You have numbness, tingling, weakness, or paralysis in any part of your body.  MAKE SURE YOU:   Understand these instructions.  Will watch your condition.  Will get help right away if you are not doing well or get worse.   This information is not intended to replace advice given to you by your health care provider. Make sure you discuss any questions you have with your health care provider.   Document Released: 12/25/2006 Document Revised: 03/04/2013 Document Reviewed: 09/04/2012 Elsevier Interactive Patient Education [employer].
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
## 2015-04-13 ENCOUNTER — Encounter: Payer: Self-pay | Admitting: *Deleted

## 2015-04-13 ENCOUNTER — Other Ambulatory Visit: Payer: Self-pay | Admitting: *Deleted
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
## 2015-04-15 DIAGNOSIS — M542 Cervicalgia: Secondary | ICD-10-CM | POA: Diagnosis not present

## 2015-04-15 DIAGNOSIS — M549 Dorsalgia, unspecified: Secondary | ICD-10-CM | POA: Diagnosis not present

## 2015-04-15 DIAGNOSIS — J309 Allergic rhinitis, unspecified: Secondary | ICD-10-CM | POA: Diagnosis not present

## 2015-04-30 DIAGNOSIS — C61 Malignant neoplasm of prostate: Secondary | ICD-10-CM | POA: Diagnosis not present

## 2015-05-17 DIAGNOSIS — I10 Essential (primary) hypertension: Secondary | ICD-10-CM | POA: Diagnosis not present

## 2015-05-17 DIAGNOSIS — J329 Chronic sinusitis, unspecified: Secondary | ICD-10-CM | POA: Diagnosis not present

## 2015-05-17 DIAGNOSIS — J309 Allergic rhinitis, unspecified: Secondary | ICD-10-CM | POA: Diagnosis not present

## 2015-05-17 DIAGNOSIS — E119 Type 2 diabetes mellitus without complications: Secondary | ICD-10-CM | POA: Diagnosis not present

## 2015-06-03 DIAGNOSIS — J309 Allergic rhinitis, unspecified: Secondary | ICD-10-CM | POA: Diagnosis not present

## 2015-06-18 DIAGNOSIS — C61 Malignant neoplasm of prostate: Secondary | ICD-10-CM | POA: Diagnosis not present

## 2015-06-18 DIAGNOSIS — Z125 Encounter for screening for malignant neoplasm of prostate: Secondary | ICD-10-CM | POA: Diagnosis not present

## 2015-06-18 DIAGNOSIS — Z08 Encounter for follow-up examination after completed treatment for malignant neoplasm: Secondary | ICD-10-CM | POA: Diagnosis not present

## 2015-06-18 DIAGNOSIS — Z8546 Personal history of malignant neoplasm of prostate: Secondary | ICD-10-CM | POA: Diagnosis not present

## 2015-06-18 DIAGNOSIS — N5231 Erectile dysfunction following radical prostatectomy: Secondary | ICD-10-CM | POA: Diagnosis not present

## 2015-06-18 DIAGNOSIS — R32 Unspecified urinary incontinence: Secondary | ICD-10-CM | POA: Diagnosis not present

## 2015-06-18 DIAGNOSIS — N529 Male erectile dysfunction, unspecified: Secondary | ICD-10-CM | POA: Diagnosis not present

## 2015-06-28 ENCOUNTER — Encounter: Payer: Self-pay | Admitting: Radiation Oncology

## 2015-07-12 DIAGNOSIS — Z23 Encounter for immunization: Secondary | ICD-10-CM | POA: Diagnosis not present

## 2015-07-12 DIAGNOSIS — M542 Cervicalgia: Secondary | ICD-10-CM | POA: Diagnosis not present

## 2015-07-12 DIAGNOSIS — J309 Allergic rhinitis, unspecified: Secondary | ICD-10-CM | POA: Diagnosis not present

## 2015-07-12 DIAGNOSIS — Z0000 Encounter for general adult medical examination without abnormal findings: Secondary | ICD-10-CM | POA: Diagnosis not present

## 2015-07-12 DIAGNOSIS — I10 Essential (primary) hypertension: Secondary | ICD-10-CM | POA: Diagnosis not present

## 2015-07-12 DIAGNOSIS — M549 Dorsalgia, unspecified: Secondary | ICD-10-CM | POA: Diagnosis not present

## 2015-07-23 ENCOUNTER — Encounter: Payer: Self-pay | Admitting: Radiation Oncology
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
## 2015-07-26 ENCOUNTER — Ambulatory Visit
Admission: RE | Admit: 2015-07-26 | Discharge: 2015-07-26 | Disposition: A | Discharge status: Home / Self Care | Payer: PPO | Source: Ambulatory Visit | Attending: Radiation Oncology | Admitting: Radiation Oncology

## 2015-07-26 VITALS — BP 122/78 | HR 74 | Resp 16 | Wt 143.4 lb

## 2015-07-26 DIAGNOSIS — C61 Malignant neoplasm of prostate: Secondary | ICD-10-CM | POA: Diagnosis not present

## 2015-07-26 DIAGNOSIS — Z510 Encounter for antineoplastic radiation therapy: Secondary | ICD-10-CM | POA: Insufficient documentation

## 2015-07-26 DIAGNOSIS — Z8546 Personal history of malignant neoplasm of prostate: Secondary | ICD-10-CM | POA: Insufficient documentation

## 2015-07-26 HISTORY — DX: Malignant neoplasm of prostate: C61
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
## 2015-07-26 NOTE — Progress Notes (Signed)
See progress note under physician encounter. 

## 2015-07-29 ENCOUNTER — Ambulatory Visit: Admission: RE | Admit: 2015-07-29 | Payer: PPO | Source: Ambulatory Visit | Admitting: Radiation Oncology

## 2015-08-05 ENCOUNTER — Ambulatory Visit
Admission: RE | Admit: 2015-08-05 | Discharge: 2015-08-05 | Disposition: A | Discharge status: Home / Self Care | Payer: PPO | Source: Ambulatory Visit | Attending: Radiation Oncology | Admitting: Radiation Oncology

## 2015-08-05 DIAGNOSIS — C61 Malignant neoplasm of prostate: Secondary | ICD-10-CM

## 2015-08-05 DIAGNOSIS — Z510 Encounter for antineoplastic radiation therapy: Secondary | ICD-10-CM | POA: Diagnosis not present

## 2015-08-06 NOTE — Progress Notes (Signed)
  Radiation Oncology         [phone] ________________________________  Name: Glenn Logan MRN: [id number]  Date: 08/05/2015  DOB: 1946/02/12  SIMULATION AND TREATMENT PLANNING NOTE    ICD-9-CM ICD-10-CM   1. Malignant neoplasm of prostate ([place]) 185 C61     DIAGNOSIS:  70 y.o. gentleman with stage T3b adenocarcinoma of the prostate with a Gleason's score of 5+4 and a post-prostatectomy PSA of 0.26  NARRATIVE:  The patient was brought to the [place].  Identity was confirmed.  All relevant records and images related to the planned course of therapy were reviewed.  The patient freely provided informed written consent to proceed with treatment after reviewing the details related to the planned course of therapy. The consent form was witnessed and verified by the simulation staff.  Then, the patient was set-up in a stable reproducible supine position for radiation therapy.  A vacuum lock pillow device was custom fabricated to position his legs in a reproducible immobilized position.  Then, I performed a urethrogram under sterile conditions to identify the prostatic apex.  CT images were obtained.  Surface markings were placed.  The CT images were loaded into the planning software.  Then the prostate target and avoidance structures including the rectum, bladder, bowel and hips were contoured.  Treatment planning then occurred.  The radiation prescription was entered and confirmed.  A total of 1 complex treatment device was fabricated. I have requested : Intensity Modulated Radiotherapy (IMRT) is medically necessary for this case for the following reason:  Rectal sparing.[name]  PLAN:  The patient will receive 68.4 Gy in 38 fractions.  ________________________________  Sheral Apley Tammi Klippel, [name]

## 2015-08-12 DIAGNOSIS — C61 Malignant neoplasm of prostate: Secondary | ICD-10-CM | POA: Diagnosis not present

## 2015-08-12 DIAGNOSIS — Z510 Encounter for antineoplastic radiation therapy: Secondary | ICD-10-CM | POA: Diagnosis not present

## 2015-08-16 DIAGNOSIS — Z510 Encounter for antineoplastic radiation therapy: Secondary | ICD-10-CM | POA: Diagnosis not present

## 2015-08-17 ENCOUNTER — Ambulatory Visit
Admission: RE | Admit: 2015-08-17 | Discharge: 2015-08-17 | Disposition: A | Discharge status: Home / Self Care | Payer: PPO | Source: Ambulatory Visit | Attending: Radiation Oncology | Admitting: Radiation Oncology

## 2015-08-17 DIAGNOSIS — C61 Malignant neoplasm of prostate: Secondary | ICD-10-CM | POA: Diagnosis not present

## 2015-08-17 DIAGNOSIS — Z510 Encounter for antineoplastic radiation therapy: Secondary | ICD-10-CM | POA: Diagnosis not present

## 2015-08-18 ENCOUNTER — Ambulatory Visit
Admission: RE | Admit: 2015-08-18 | Discharge: 2015-08-18 | Disposition: A | Discharge status: Home / Self Care | Payer: PPO | Source: Ambulatory Visit | Attending: Radiation Oncology | Admitting: Radiation Oncology

## 2015-08-18 DIAGNOSIS — C61 Malignant neoplasm of prostate: Secondary | ICD-10-CM | POA: Diagnosis not present

## 2015-08-18 DIAGNOSIS — Z510 Encounter for antineoplastic radiation therapy: Secondary | ICD-10-CM | POA: Diagnosis not present

## 2015-08-19 ENCOUNTER — Ambulatory Visit
Admission: RE | Admit: 2015-08-19 | Discharge: 2015-08-19 | Disposition: A | Discharge status: Home / Self Care | Payer: PPO | Source: Ambulatory Visit | Attending: Radiation Oncology | Admitting: Radiation Oncology

## 2015-08-19 DIAGNOSIS — C61 Malignant neoplasm of prostate: Secondary | ICD-10-CM | POA: Diagnosis not present

## 2015-08-19 DIAGNOSIS — Z510 Encounter for antineoplastic radiation therapy: Secondary | ICD-10-CM | POA: Diagnosis not present

## 2015-08-20 ENCOUNTER — Encounter: Payer: Self-pay | Admitting: Radiation Oncology

## 2015-08-20 ENCOUNTER — Ambulatory Visit
Admission: RE | Admit: 2015-08-20 | Discharge: 2015-08-20 | Disposition: A | Discharge status: Home / Self Care | Payer: PPO | Source: Ambulatory Visit | Attending: Radiation Oncology | Admitting: Radiation Oncology

## 2015-08-20 VITALS — BP 143/75 | HR 59 | Resp 16 | Wt 144.6 lb

## 2015-08-20 DIAGNOSIS — C61 Malignant neoplasm of prostate: Secondary | ICD-10-CM | POA: Diagnosis not present

## 2015-08-20 DIAGNOSIS — Z510 Encounter for antineoplastic radiation therapy: Secondary | ICD-10-CM | POA: Diagnosis not present

## 2015-08-20 NOTE — Progress Notes (Signed)
  Radiation Oncology         [phone]   Name: Glenn Logan MRN: [id number]   Date: 08/20/2015  DOB: 01-Dec-1945     Weekly Radiation Therapy Management    ICD-9-CM ICD-10-CM   1. Malignant neoplasm of prostate ([place]) 185 C61     Current Dose: 7.2 Gy  Planned Dose:  68.4 Gy  Narrative The patient presents for routine under treatment assessment.  Weight and vitals stable. Denies pain. Denies nocturia. Denies dysuria or hematuria. Reports on rare occasions he has a small amount of urinary leakage. Describes a strong steady stream. Reports occasional difficulty emptying. Denies diarrhea or constipation. Denies fatigue.     The patient is without complaint. Set-up films were reviewed. The chart was checked.  Physical Findings  weight is 144 lb 9.6 oz (65.59 kg). His blood pressure is 143/75 and his pulse is 59. His respiration is 16 and oxygen saturation is 100%. . Weight essentially stable.  No significant changes.  Impression The patient is tolerating radiation.  Plan Continue treatment as planned.      Sheral Apley Tammi Klippel, [name]   This document serves as a record of services personally performed by Tyler Pita, MD. It was created on his behalf by Derek Mound, a trained medical scribe. The creation of this record is based on the scribe's personal observations and the provider's statements to them. This document has been checked and approved by the attending provider.

## 2015-08-20 NOTE — Progress Notes (Addendum)
Weight and vitals stable. Denies pain. Denies nocturia. Denies dysuria or hematuria. Reports on rare occasions he has a small amount of urinary leakage. Describes a strong steady stream. Reports occasional difficulty emptying. Denies diarrhea or constipation. Denies fatigue. Oriented patient to staff and routine of the clinic. Provided patient with RADIATION THERAPY AND YOU handbook then, reviewed pertinent information. Educated patient reference potential side effects and management such as fatigue, diarrhea, and urinary/bladder changes. Answered all patient questions to the best of my ability. Provided patient with my business card and encouraged him to call with future needs. Patient verbalized understanding of all reviewed.    Wt Readings from Last 3 Encounters:  08/20/15 144 lb 9.6 oz (65.59 kg)  07/26/15 143 lb 6.4 oz (65.046 kg)  03/01/15 142 lb (64.411 kg)   BP 143/75 mmHg  Pulse 59  Resp 16  Wt 144 lb 9.6 oz (65.59 kg)  SpO2 100%

## 2015-08-23 ENCOUNTER — Ambulatory Visit
Admission: RE | Admit: 2015-08-23 | Discharge: 2015-08-23 | Disposition: A | Discharge status: Home / Self Care | Payer: PPO | Source: Ambulatory Visit | Attending: Radiation Oncology | Admitting: Radiation Oncology

## 2015-08-23 DIAGNOSIS — C61 Malignant neoplasm of prostate: Secondary | ICD-10-CM | POA: Diagnosis not present

## 2015-08-23 DIAGNOSIS — Z510 Encounter for antineoplastic radiation therapy: Secondary | ICD-10-CM | POA: Diagnosis not present

## 2015-08-24 ENCOUNTER — Ambulatory Visit
Admission: RE | Admit: 2015-08-24 | Discharge: 2015-08-24 | Disposition: A | Discharge status: Home / Self Care | Payer: PPO | Source: Ambulatory Visit | Attending: Radiation Oncology | Admitting: Radiation Oncology

## 2015-08-24 DIAGNOSIS — Z510 Encounter for antineoplastic radiation therapy: Secondary | ICD-10-CM | POA: Diagnosis not present

## 2015-08-24 DIAGNOSIS — C61 Malignant neoplasm of prostate: Secondary | ICD-10-CM | POA: Diagnosis not present

## 2015-08-25 ENCOUNTER — Ambulatory Visit
Admission: RE | Admit: 2015-08-25 | Discharge: 2015-08-25 | Disposition: A | Discharge status: Home / Self Care | Payer: PPO | Source: Ambulatory Visit | Attending: Radiation Oncology | Admitting: Radiation Oncology

## 2015-08-25 DIAGNOSIS — C61 Malignant neoplasm of prostate: Secondary | ICD-10-CM | POA: Diagnosis not present

## 2015-08-25 DIAGNOSIS — Z510 Encounter for antineoplastic radiation therapy: Secondary | ICD-10-CM | POA: Diagnosis not present

## 2015-08-25 NOTE — Addendum Note (Signed)
Encounter addended by: Heywood Footman, RN on: 08/25/2015  3:42 PM<BR>     Documentation filed: Inpatient Patient Education, Notes Section, Chief Complaint Section

## 2015-08-26 ENCOUNTER — Ambulatory Visit
Admission: RE | Admit: 2015-08-26 | Discharge: 2015-08-26 | Disposition: A | Discharge status: Home / Self Care | Payer: PPO | Source: Ambulatory Visit | Attending: Radiation Oncology | Admitting: Radiation Oncology

## 2015-08-26 DIAGNOSIS — Z510 Encounter for antineoplastic radiation therapy: Secondary | ICD-10-CM | POA: Diagnosis not present

## 2015-08-26 DIAGNOSIS — C61 Malignant neoplasm of prostate: Secondary | ICD-10-CM | POA: Diagnosis not present

## 2015-08-27 ENCOUNTER — Ambulatory Visit
Admission: RE | Admit: 2015-08-27 | Discharge: 2015-08-27 | Disposition: A | Discharge status: Home / Self Care | Payer: PPO | Source: Ambulatory Visit | Attending: Radiation Oncology | Admitting: Radiation Oncology

## 2015-08-27 ENCOUNTER — Encounter: Payer: Self-pay | Admitting: Radiation Oncology

## 2015-08-27 VITALS — BP 149/88 | HR 54 | Resp 16 | Wt 144.2 lb

## 2015-08-27 DIAGNOSIS — C61 Malignant neoplasm of prostate: Secondary | ICD-10-CM

## 2015-08-27 DIAGNOSIS — Z510 Encounter for antineoplastic radiation therapy: Secondary | ICD-10-CM | POA: Diagnosis not present

## 2015-08-27 NOTE — Progress Notes (Signed)
Weight and vitals stable. Denies pain. Denies nocturia. Denies dysuria or hematuria. Reports on rare occasions he has a small amount of urinary leakage. Describes a strong steady stream. Reports occasional difficulty emptying. Denies diarrhea or constipation. Denies fatigue.   BP 149/88 mmHg  Pulse 54  Resp 16  Wt 144 lb 3.2 oz (65.409 kg)  SpO2 100% Wt Readings from Last 3 Encounters:  08/27/15 144 lb 3.2 oz (65.409 kg)  08/20/15 144 lb 9.6 oz (65.59 kg)  07/26/15 143 lb 6.4 oz (65.046 kg)

## 2015-08-27 NOTE — Progress Notes (Signed)
Department of Radiation Oncology  Phone:  [phone] Fax:        [phone]  Weekly Treatment Note    Name: Glenn Logan Date: 08/28/2015 MRN: [id number] DOB: 04-27-1945   Diagnosis:     ICD-9-CM ICD-10-CM   1. Malignant neoplasm of prostate ([place]) 185 C61      Current dose: 16.2 Gy  Current fraction:9   MEDICATIONS: Current Outpatient Prescriptions  Medication Sig Dispense Refill  . albuterol (ACCUNEB) 0.63 MG/3ML nebulizer solution Take 1 ampule by nebulization every 6 (six) hours as needed for wheezing or shortness of breath.     . budesonide-formoterol (SYMBICORT) 160-4.5 MCG/ACT inhaler Inhale 2 puffs into the lungs 2 (two) times daily as needed (shortness of breath).     . cetirizine (ZYRTEC) 10 MG tablet Take 10 mg by mouth daily.    . finasteride (PROSCAR) 5 MG tablet Take 5 mg by mouth daily.    . fluticasone (FLONASE) 50 MCG/ACT nasal spray Place 2 sprays into both nostrils daily.    [name] losartan (COZAAR) 100 MG tablet Take 50 mg by mouth daily.    . methocarbamol (ROBAXIN) 500 MG tablet Take 1 tablet (500 mg total) by mouth 4 (four) times daily. 28 tablet 0  . Multiple Vitamin (MULTIVITAMIN WITH MINERALS) TABS tablet Take 1 tablet by mouth daily.    . polyethylene glycol (MIRALAX / GLYCOLAX) packet Take 17 g by mouth daily. (Patient taking differently: Take 17 g by mouth daily as needed for moderate constipation. ) 14 each 0  . warfarin (COUMADIN) 5 MG tablet Take 5-7.5 mg by mouth daily. Take 1 and a half tablet (7.5mg ) on Tuesday, Thursday, and Sat.  And all other days (Monday, Wednesday, friday and Sunday) take 5mg .    . zolpidem (AMBIEN) 10 MG tablet Take 5 mg by mouth at bedtime as needed for sleep.    [name] ondansetron (ZOFRAN ODT) 4 MG disintegrating tablet Take 1 tablet (4 mg total) by mouth every 8 (eight) hours as needed for nausea or vomiting. (Patient not taking: Reported on 03/01/2015) 20 tablet 0   No current facility-administered medications for  this encounter.     ALLERGIES: Penicillins and Sulfa antibiotics   LABORATORY DATA:  Lab Results  Component Value Date   WBC 11.0* 01/08/2015   HGB 12.0* 01/08/2015   HCT 37.0* 01/08/2015   MCV 91.4 01/08/2015   PLT 213 01/08/2015   Lab Results  Component Value Date   NA 139 01/08/2015   K 3.8 01/08/2015   CL 105 01/08/2015   CO2 28 01/08/2015   Lab Results  Component Value Date   ALT 12* 01/07/2015   AST 13* 01/07/2015   ALKPHOS 38 01/07/2015   BILITOT 1.0 01/07/2015     NARRATIVE: Glenn Logan was seen today for weekly treatment management. The chart was checked and the patient's films were reviewed.  Denies pain,  Nocturia, dysuria, hematuria, diarrhea, constipation, or fatigue. Reports on rare occasions he has a small amount of urinary leakage. Describes a strong steady stream. Reports occasional difficulty emptying.  PHYSICAL EXAMINATION: weight is 144 lb 3.2 oz (65.409 kg). His blood pressure is 149/88 and his pulse is 54. His respiration is 16 and oxygen saturation is 100%.   ASSESSMENT: The patient is doing satisfactorily with treatment.  PLAN: We will continue with the patient's radiation treatment as planned.   ------------------------------------------------  Jodelle Gross, MD, PhD  This document serves as a record of services personally performed by Jenny Reichmann  Lisbeth Renshaw, MD. It was created on his behalf by Darcus Austin, a trained medical scribe. The creation of this record is based on the scribe's personal observations and the provider's statements to them. This document has been checked and approved by the attending provider.

## 2015-08-30 ENCOUNTER — Ambulatory Visit
Admission: RE | Admit: 2015-08-30 | Discharge: 2015-08-30 | Disposition: A | Discharge status: Home / Self Care | Payer: PPO | Source: Ambulatory Visit | Attending: Radiation Oncology | Admitting: Radiation Oncology

## 2015-08-30 DIAGNOSIS — C61 Malignant neoplasm of prostate: Secondary | ICD-10-CM | POA: Diagnosis not present

## 2015-08-30 DIAGNOSIS — Z510 Encounter for antineoplastic radiation therapy: Secondary | ICD-10-CM | POA: Diagnosis not present

## 2015-08-31 ENCOUNTER — Ambulatory Visit
Admission: RE | Admit: 2015-08-31 | Discharge: 2015-08-31 | Disposition: A | Discharge status: Home / Self Care | Payer: PPO | Source: Ambulatory Visit | Attending: Radiation Oncology | Admitting: Radiation Oncology

## 2015-08-31 DIAGNOSIS — C61 Malignant neoplasm of prostate: Secondary | ICD-10-CM | POA: Diagnosis not present

## 2015-08-31 DIAGNOSIS — Z510 Encounter for antineoplastic radiation therapy: Secondary | ICD-10-CM | POA: Diagnosis not present

## 2015-09-01 ENCOUNTER — Ambulatory Visit
Admission: RE | Admit: 2015-09-01 | Discharge: 2015-09-01 | Disposition: A | Discharge status: Home / Self Care | Payer: PPO | Source: Ambulatory Visit | Attending: Radiation Oncology | Admitting: Radiation Oncology

## 2015-09-01 DIAGNOSIS — Z510 Encounter for antineoplastic radiation therapy: Secondary | ICD-10-CM | POA: Diagnosis not present

## 2015-09-01 DIAGNOSIS — C61 Malignant neoplasm of prostate: Secondary | ICD-10-CM | POA: Diagnosis not present

## 2015-09-02 ENCOUNTER — Ambulatory Visit
Admission: RE | Admit: 2015-09-02 | Discharge: 2015-09-02 | Disposition: A | Discharge status: Home / Self Care | Payer: PPO | Source: Ambulatory Visit | Attending: Radiation Oncology | Admitting: Radiation Oncology

## 2015-09-02 DIAGNOSIS — C61 Malignant neoplasm of prostate: Secondary | ICD-10-CM | POA: Diagnosis not present

## 2015-09-02 DIAGNOSIS — Z510 Encounter for antineoplastic radiation therapy: Secondary | ICD-10-CM | POA: Diagnosis not present

## 2015-09-03 ENCOUNTER — Ambulatory Visit
Admission: RE | Admit: 2015-09-03 | Discharge: 2015-09-03 | Disposition: A | Discharge status: Home / Self Care | Payer: PPO | Source: Ambulatory Visit | Attending: Radiation Oncology | Admitting: Radiation Oncology

## 2015-09-03 ENCOUNTER — Encounter: Payer: Self-pay | Admitting: Radiation Oncology

## 2015-09-03 VITALS — BP 134/80 | HR 57 | Resp 16 | Wt 143.6 lb

## 2015-09-03 DIAGNOSIS — C61 Malignant neoplasm of prostate: Secondary | ICD-10-CM

## 2015-09-03 DIAGNOSIS — Z510 Encounter for antineoplastic radiation therapy: Secondary | ICD-10-CM | POA: Diagnosis not present

## 2015-09-03 NOTE — Progress Notes (Signed)
Weight and vitals stable. Denies pain. Denies nocturia. Denies dysuria or hematuria. Reports on rare occasions he has a small amount of urinary leakage. Describes a strong steady urine stream. Reports occasional difficulty emptying continues but, slightly improved. Denies diarrhea. Denies nausea or vomiting. Denies fatigue.   BP 134/80 mmHg  Pulse 57  Resp 16  Wt 143 lb 9.6 oz (65.137 kg)  SpO2 100% Wt Readings from Last 3 Encounters:  09/03/15 143 lb 9.6 oz (65.137 kg)  08/27/15 144 lb 3.2 oz (65.409 kg)  08/20/15 144 lb 9.6 oz (65.59 kg)

## 2015-09-03 NOTE — Progress Notes (Signed)
  Radiation Oncology         [phone]   Name: Glenn Logan MRN: [id number]   Date: 09/03/2015  DOB: Aug 03, 1945   Weekly Radiation Therapy Management    ICD-9-CM ICD-10-CM   1. Malignant neoplasm of prostate ([place]) 185 C61     Current Dose: 25.2 Gy  Planned Dose:  68.4 Gy  Narrative The patient presents for routine under treatment assessment. Weight and vitals stable. Denies pain. Denies nocturia. Denies dysuria or hematuria. Reports on rare occasions he has a small amount of urinary leakage. Describes a strong steady urine stream. Reports occasional difficulty emptying continues but, slightly improved. Denies diarrhea. Denies nausea or vomiting. Denies fatigue. Set-up films were reviewed. The chart was checked.  Physical Findings  weight is 143 lb 9.6 oz (65.137 kg). His blood pressure is 134/80 and his pulse is 57. His respiration is 16 and oxygen saturation is 100%. . Weight essentially stable.  No significant changes.  Impression The patient is tolerating radiation.  Plan Continue treatment as planned.    Sheral Apley Tammi Klippel, [name]  This document serves as a record of services personally performed by Tyler Pita, MD. It was created on his behalf by Darcus Austin, a trained medical scribe. The creation of this record is based on the scribe's personal observations and the provider's statements to them. This document has been checked and approved by the attending provider.

## 2015-09-06 ENCOUNTER — Ambulatory Visit
Admission: RE | Admit: 2015-09-06 | Discharge: 2015-09-06 | Disposition: A | Discharge status: Home / Self Care | Payer: PPO | Source: Ambulatory Visit | Attending: Radiation Oncology | Admitting: Radiation Oncology

## 2015-09-06 DIAGNOSIS — C61 Malignant neoplasm of prostate: Secondary | ICD-10-CM | POA: Diagnosis not present

## 2015-09-06 DIAGNOSIS — Z510 Encounter for antineoplastic radiation therapy: Secondary | ICD-10-CM | POA: Diagnosis not present

## 2015-09-07 ENCOUNTER — Ambulatory Visit
Admission: RE | Admit: 2015-09-07 | Discharge: 2015-09-07 | Disposition: A | Discharge status: Home / Self Care | Payer: PPO | Source: Ambulatory Visit | Attending: Radiation Oncology | Admitting: Radiation Oncology

## 2015-09-07 DIAGNOSIS — C61 Malignant neoplasm of prostate: Secondary | ICD-10-CM | POA: Diagnosis not present

## 2015-09-07 DIAGNOSIS — Z510 Encounter for antineoplastic radiation therapy: Secondary | ICD-10-CM | POA: Diagnosis not present

## 2015-09-08 ENCOUNTER — Ambulatory Visit
Admission: RE | Admit: 2015-09-08 | Discharge: 2015-09-08 | Disposition: A | Discharge status: Home / Self Care | Payer: PPO | Source: Ambulatory Visit | Attending: Radiation Oncology | Admitting: Radiation Oncology

## 2015-09-08 DIAGNOSIS — Z510 Encounter for antineoplastic radiation therapy: Secondary | ICD-10-CM | POA: Diagnosis not present

## 2015-09-08 DIAGNOSIS — C61 Malignant neoplasm of prostate: Secondary | ICD-10-CM | POA: Diagnosis not present

## 2015-09-09 ENCOUNTER — Ambulatory Visit
Admission: RE | Admit: 2015-09-09 | Discharge: 2015-09-09 | Disposition: A | Discharge status: Home / Self Care | Payer: PPO | Source: Ambulatory Visit | Attending: Radiation Oncology | Admitting: Radiation Oncology

## 2015-09-09 ENCOUNTER — Encounter: Payer: Self-pay | Admitting: Radiation Oncology

## 2015-09-09 VITALS — BP 152/85 | HR 51 | Resp 16 | Wt 144.1 lb

## 2015-09-09 DIAGNOSIS — C61 Malignant neoplasm of prostate: Secondary | ICD-10-CM | POA: Diagnosis not present

## 2015-09-09 DIAGNOSIS — Z510 Encounter for antineoplastic radiation therapy: Secondary | ICD-10-CM | POA: Diagnosis not present

## 2015-09-09 NOTE — Progress Notes (Signed)
  Radiation Oncology         [phone]   Name: Glenn Logan MRN: [id number]   Date: 09/09/2015  DOB: 04/18/1945   Weekly Radiation Therapy Management  Prostate cancer s/p prostatectomy  Current Dose: 25.2 Gy  Planned Dose:  68.4 Gy  Narrative The patient presents for routine under treatment assessment. Weight and vitals stable. Denies pain. Denies nocturia. Denies dysuria or hematuria. Reports on rare occasions he has a small amount of urinary leakage. Describes a strong steady urine stream. Reports occasional difficulty emptying continues but, slightly improved. Denies diarrhea. Denies nausea or vomiting. Denies fatigue. Set-up films were reviewed. The chart was checked.  Physical Findings  weight is 144 lb 1.6 oz (65.363 kg). His blood pressure is 152/85 and his pulse is 51. His respiration is 16 and oxygen saturation is 100%. . Weight essentially stable.  No significant changes.  Impression The patient is tolerating radiation.  Plan Continue treatment as planned.    Sheral Apley Tammi Klippel, [name]  This document serves as a record of services personally performed by Tyler Pita, MD. It was created on his behalf by Darcus Austin, a trained medical scribe. The creation of this record is based on the scribe's personal observations and the provider's statements to them. This document has been checked and approved by the attending provider.

## 2015-09-09 NOTE — Progress Notes (Addendum)
Weight and vitals stable. Denies pain. Denies nocturia. Denies dysuria or hematuria. Reports on rare occasions he has a small amount of urinary leakage. Describes a strong steady urine stream. Reports occasional difficulty emptying continues. Denies diarrhea. Denies nausea or vomiting. Denies fatigue. Patient has long ago planned for a vacation to [place] scheduled to fly out on July [id number] time uncertain however, patient isn't scheduled to complete XRT until July 28th at 3 pm. Patient anxious to resolve this conflict because he needs to finalize vacation plans.   BP 152/85 mmHg  Pulse 51  Resp 16  Wt 144 lb 1.6 oz (65.363 kg)  SpO2 100% Wt Readings from Last 3 Encounters:  09/09/15 144 lb 1.6 oz (65.363 kg)  09/03/15 143 lb 9.6 oz (65.137 kg)  08/27/15 144 lb 3.2 oz (65.409 kg)

## 2015-09-10 ENCOUNTER — Ambulatory Visit
Admission: RE | Admit: 2015-09-10 | Discharge: 2015-09-10 | Disposition: A | Discharge status: Home / Self Care | Payer: PPO | Source: Ambulatory Visit | Attending: Radiation Oncology | Admitting: Radiation Oncology

## 2015-09-10 ENCOUNTER — Encounter: Payer: Self-pay | Admitting: Radiation Oncology

## 2015-09-10 DIAGNOSIS — C61 Malignant neoplasm of prostate: Secondary | ICD-10-CM | POA: Diagnosis not present

## 2015-09-10 DIAGNOSIS — Z510 Encounter for antineoplastic radiation therapy: Secondary | ICD-10-CM | POA: Diagnosis not present

## 2015-09-10 NOTE — Progress Notes (Signed)
Patient Information    Patient Name Sex DOB SSN   Glenn Logan, Glenn Logan Male [birth year] [id number]    Progress Notes by Heywood Footman, RN at 09/09/2015 3:46 PM    Author: Heywood Footman, RN Service: (none) Author Type: Registered Nurse   Filed: 09/09/2015 3:49 PM Note Time: 09/09/2015 3:46 PM Status: Addendum             Weight and vitals stable. Denies pain. Denies nocturia. Denies dysuria or hematuria. Reports on rare occasions he has a small amount of urinary leakage. Describes a strong steady urine stream. Reports occasional difficulty emptying continues. Denies diarrhea. Denies nausea or vomiting. Denies fatigue. Patient has long ago planned for a vacation to [place] scheduled to fly out on July [id number] time uncertain however, patient isn't scheduled to complete XRT until July 28th at 3 pm. Patient anxious to resolve this conflict because he needs to finalize vacation. Wt Readings from Last 3 Encounters:  09/10/15 145 lb (65.772 kg)  09/09/15 144 lb 1.6 oz (65.363 kg)  09/03/15 143 lb 9.6 oz (65.137 kg)   BP 127/90 mmHg  Pulse 62  Temp(Src) 97.6 F (36.4 C) (Oral)  Resp 18  Ht 5\' 6"  (1.676 m)  Wt 145 lb (65.772 kg)  BMI 23.41 kg/m2  SpO2 100%

## 2015-09-13 ENCOUNTER — Ambulatory Visit
Admission: RE | Admit: 2015-09-13 | Discharge: 2015-09-13 | Disposition: A | Discharge status: Home / Self Care | Payer: PPO | Source: Ambulatory Visit | Attending: Radiation Oncology | Admitting: Radiation Oncology

## 2015-09-13 DIAGNOSIS — Z510 Encounter for antineoplastic radiation therapy: Secondary | ICD-10-CM | POA: Diagnosis not present

## 2015-09-13 DIAGNOSIS — C61 Malignant neoplasm of prostate: Secondary | ICD-10-CM | POA: Diagnosis not present

## 2015-09-15 ENCOUNTER — Ambulatory Visit
Admission: RE | Admit: 2015-09-15 | Discharge: 2015-09-15 | Disposition: A | Discharge status: Home / Self Care | Payer: PPO | Source: Ambulatory Visit | Attending: Radiation Oncology | Admitting: Radiation Oncology

## 2015-09-15 DIAGNOSIS — Z510 Encounter for antineoplastic radiation therapy: Secondary | ICD-10-CM | POA: Diagnosis not present

## 2015-09-15 DIAGNOSIS — C61 Malignant neoplasm of prostate: Secondary | ICD-10-CM | POA: Diagnosis not present

## 2015-09-16 ENCOUNTER — Ambulatory Visit
Admission: RE | Admit: 2015-09-16 | Discharge: 2015-09-16 | Disposition: A | Discharge status: Home / Self Care | Payer: PPO | Source: Ambulatory Visit | Attending: Radiation Oncology | Admitting: Radiation Oncology

## 2015-09-16 DIAGNOSIS — Z510 Encounter for antineoplastic radiation therapy: Secondary | ICD-10-CM | POA: Diagnosis not present

## 2015-09-16 DIAGNOSIS — C61 Malignant neoplasm of prostate: Secondary | ICD-10-CM | POA: Diagnosis not present

## 2015-09-17 ENCOUNTER — Ambulatory Visit
Admission: RE | Admit: 2015-09-17 | Discharge: 2015-09-17 | Disposition: A | Discharge status: Home / Self Care | Payer: PPO | Source: Ambulatory Visit | Attending: Radiation Oncology | Admitting: Radiation Oncology

## 2015-09-17 ENCOUNTER — Encounter: Payer: Self-pay | Admitting: Radiation Oncology

## 2015-09-17 VITALS — BP 124/92 | HR 63 | Resp 16 | Wt 145.0 lb

## 2015-09-17 DIAGNOSIS — C61 Malignant neoplasm of prostate: Secondary | ICD-10-CM

## 2015-09-17 DIAGNOSIS — Z510 Encounter for antineoplastic radiation therapy: Secondary | ICD-10-CM | POA: Diagnosis not present

## 2015-09-17 NOTE — Progress Notes (Signed)
Department of Radiation Oncology  Phone:  [phone] Fax:        [phone]  Weekly Treatment Note    Name: Glenn Logan Date: 09/17/2015 MRN: [id number] DOB: Jun 29, 1945   Diagnosis:     ICD-9-CM ICD-10-CM   1. Malignant neoplasm of prostate ([place]) 185 C61      Current dose: 41.4 Gy  Current fraction: 23   MEDICATIONS: Current Outpatient Prescriptions  Medication Sig Dispense Refill  . albuterol (ACCUNEB) 0.63 MG/3ML nebulizer solution Take 1 ampule by nebulization every 6 (six) hours as needed for wheezing or shortness of breath.     . budesonide-formoterol (SYMBICORT) 160-4.5 MCG/ACT inhaler Inhale 2 puffs into the lungs 2 (two) times daily as needed (shortness of breath).     . cetirizine (ZYRTEC) 10 MG tablet Take 10 mg by mouth daily.    . finasteride (PROSCAR) 5 MG tablet Take 5 mg by mouth daily.    . fluticasone (FLONASE) 50 MCG/ACT nasal spray Place 2 sprays into both nostrils daily.    [name] losartan (COZAAR) 100 MG tablet Take 50 mg by mouth daily.    . methocarbamol (ROBAXIN) 500 MG tablet Take 1 tablet (500 mg total) by mouth 4 (four) times daily. 28 tablet 0  . Multiple Vitamin (MULTIVITAMIN WITH MINERALS) TABS tablet Take 1 tablet by mouth daily.    [name] warfarin (COUMADIN) 5 MG tablet Take 5-7.5 mg by mouth daily. Take 1 and a half tablet (7.5mg ) on Tuesday, Thursday, and Sat.  And all other days (Monday, Wednesday, friday and Sunday) take 5mg .    . zolpidem (AMBIEN) 10 MG tablet Take 5 mg by mouth at bedtime as needed for sleep.    [name] ondansetron (ZOFRAN ODT) 4 MG disintegrating tablet Take 1 tablet (4 mg total) by mouth every 8 (eight) hours as needed for nausea or vomiting. (Patient not taking: Reported on 03/01/2015) 20 tablet 0  . polyethylene glycol (MIRALAX / GLYCOLAX) packet Take 17 g by mouth daily. (Patient not taking: Reported on 09/03/2015) 14 each 0   No current facility-administered medications for this encounter.     ALLERGIES: Penicillins  and Sulfa antibiotics   LABORATORY DATA:  Lab Results  Component Value Date   WBC 11.0* 01/08/2015   HGB 12.0* 01/08/2015   HCT 37.0* 01/08/2015   MCV 91.4 01/08/2015   PLT 213 01/08/2015   Lab Results  Component Value Date   NA 139 01/08/2015   K 3.8 01/08/2015   CL 105 01/08/2015   CO2 28 01/08/2015   Lab Results  Component Value Date   ALT 12* 01/07/2015   AST 13* 01/07/2015   ALKPHOS 38 01/07/2015   BILITOT 1.0 01/07/2015     NARRATIVE: Glenn Logan was seen today for weekly treatment management. The chart was checked and the patient's films were reviewed.  Weight and vitals stable. Denies pain. Denies nocturia. Denies dysuria or hematuria. He has occasional urinary incontinence but very mild. Reports on rare occasions he has a small amount of urinary leakage. Describes a strong steady urine stream. Reports occasional difficulty emptying continues. Denies diarrhea. Denies nausea or vomiting. Denies fatigue. He has been doing well and is without complaint.  PHYSICAL EXAMINATION: weight is 145 lb (65.772 kg). His blood pressure is 124/92 and his pulse is 63. His respiration is 16 and oxygen saturation is 100%.      Alert. In no acute distress.  ASSESSMENT: The patient is doing satisfactorily with treatment.  PLAN: We will continue with  the patient's radiation treatment as planned.     ------------------------------------------------  Jodelle Gross, MD, PhD  This document serves as a record of services personally performed by Kyung Rudd, MD and Shona Simpson, PA-C. It was created on his behalf by Arlyce Harman, a trained medical scribe. The creation of this record is based on the scribe's personal observations and the provider's statements to them. This document has been checked and approved by the attending provider.

## 2015-09-17 NOTE — Progress Notes (Signed)
Weight and vitals stable. Denies pain. Denies nocturia. Denies dysuria or hematuria. Reports on rare occasions he has a small amount of urinary leakage. Describes a strong steady urine stream. Reports occasional difficulty emptying continues. Denies diarrhea. Denies nausea or vomiting. Denies fatigue.  BP 124/92 mmHg  Pulse 63  Resp 16  Wt 145 lb (65.772 kg)  SpO2 100% Wt Readings from Last 3 Encounters:  09/17/15 145 lb (65.772 kg)  09/10/15 145 lb (65.772 kg)  09/09/15 144 lb 1.6 oz (65.363 kg)

## 2015-09-20 ENCOUNTER — Ambulatory Visit
Admission: RE | Admit: 2015-09-20 | Discharge: 2015-09-20 | Disposition: A | Discharge status: Home / Self Care | Payer: PPO | Source: Ambulatory Visit | Attending: Radiation Oncology | Admitting: Radiation Oncology

## 2015-09-20 DIAGNOSIS — C61 Malignant neoplasm of prostate: Secondary | ICD-10-CM | POA: Diagnosis not present

## 2015-09-20 DIAGNOSIS — M549 Dorsalgia, unspecified: Secondary | ICD-10-CM | POA: Diagnosis not present

## 2015-09-20 DIAGNOSIS — M542 Cervicalgia: Secondary | ICD-10-CM | POA: Diagnosis not present

## 2015-09-20 DIAGNOSIS — Z23 Encounter for immunization: Secondary | ICD-10-CM | POA: Diagnosis not present

## 2015-09-20 DIAGNOSIS — Z510 Encounter for antineoplastic radiation therapy: Secondary | ICD-10-CM | POA: Diagnosis not present

## 2015-09-20 DIAGNOSIS — R51 Headache: Secondary | ICD-10-CM | POA: Diagnosis not present

## 2015-09-21 ENCOUNTER — Ambulatory Visit
Admission: RE | Admit: 2015-09-21 | Discharge: 2015-09-21 | Disposition: A | Discharge status: Home / Self Care | Payer: PPO | Source: Ambulatory Visit | Attending: Radiation Oncology | Admitting: Radiation Oncology

## 2015-09-21 DIAGNOSIS — Z510 Encounter for antineoplastic radiation therapy: Secondary | ICD-10-CM | POA: Diagnosis not present

## 2015-09-21 DIAGNOSIS — C61 Malignant neoplasm of prostate: Secondary | ICD-10-CM | POA: Diagnosis not present

## 2015-09-22 ENCOUNTER — Ambulatory Visit
Admission: RE | Admit: 2015-09-22 | Discharge: 2015-09-22 | Disposition: A | Discharge status: Home / Self Care | Payer: PPO | Source: Ambulatory Visit | Attending: Radiation Oncology | Admitting: Radiation Oncology

## 2015-09-22 DIAGNOSIS — C61 Malignant neoplasm of prostate: Secondary | ICD-10-CM | POA: Diagnosis not present

## 2015-09-22 DIAGNOSIS — Z510 Encounter for antineoplastic radiation therapy: Secondary | ICD-10-CM | POA: Diagnosis not present

## 2015-09-23 ENCOUNTER — Ambulatory Visit
Admission: RE | Admit: 2015-09-23 | Discharge: 2015-09-23 | Disposition: A | Discharge status: Home / Self Care | Payer: PPO | Source: Ambulatory Visit | Attending: Radiation Oncology | Admitting: Radiation Oncology

## 2015-09-23 DIAGNOSIS — Z510 Encounter for antineoplastic radiation therapy: Secondary | ICD-10-CM | POA: Diagnosis not present

## 2015-09-23 DIAGNOSIS — C61 Malignant neoplasm of prostate: Secondary | ICD-10-CM | POA: Diagnosis not present

## 2015-09-24 ENCOUNTER — Encounter: Payer: Self-pay | Admitting: Radiation Oncology

## 2015-09-24 ENCOUNTER — Ambulatory Visit
Admission: RE | Admit: 2015-09-24 | Discharge: 2015-09-24 | Disposition: A | Discharge status: Home / Self Care | Payer: PPO | Source: Ambulatory Visit | Attending: Radiation Oncology | Admitting: Radiation Oncology

## 2015-09-24 VITALS — BP 120/76 | HR 64 | Resp 16 | Wt 142.2 lb

## 2015-09-24 DIAGNOSIS — Z510 Encounter for antineoplastic radiation therapy: Secondary | ICD-10-CM | POA: Diagnosis not present

## 2015-09-24 DIAGNOSIS — C61 Malignant neoplasm of prostate: Secondary | ICD-10-CM

## 2015-09-24 NOTE — Progress Notes (Signed)
  Radiation Oncology         [phone]   Name: Glenn Logan   Date: 09/24/2015  DOB: 10/18/1945     Weekly Radiation Therapy Management    ICD-9-CM ICD-10-CM   1. Malignant neoplasm of prostate ([place]) 185 C61     Current Dose: 50.4 Gy  Planned Dose:  68.4 Gy  Narrative The patient presents for routine under treatment assessment.  Weight and vitals stable. Reports waking daily since last Wednesday with a headache. Reports he was seen by his PCP and instructed to take Tylenol before bed. He also notes having a high BP when he saw his PCP on Monday and when he checked it at home. Reports doing as directed but knows he can't take tylenol for prolonged periods due to coumadin intake. Denies nocturia, dysuria or hematuria. Reports 2 out of 7 day a scant amount of leakage. Reports urine stream is weaker. Reports occasional difficulty emptying. Reports stool feels firmer but, denies constipation. Denies fatigue. Reports he played 18 holes of golf today.  Set-up films were reviewed. The chart was checked.  Physical Findings  weight is 142 lb 3.2 oz (64.501 kg). His blood pressure is 120/76 and his pulse is 64. His respiration is 16 and oxygen saturation is 100%. . Weight essentially stable.  No significant changes. Normal BP noted.  Impression The patient is tolerating radiation.  Plan Continue treatment as planned. Spoke with the patient about headache concerns, encouraging him to drink plenty of fluids and get adequate sleep. I also encouraged the patient to try caffeine intake as it can sometimes alleviate headaches.          Sheral Apley Tammi Klippel, [name]  This document serves as a record of services personally performed by Tyler Pita, MD. It was created on his behalf by Arlyce Harman, a trained medical scribe. The creation of this record is based on the scribe's personal observations and the provider's statements to them. This document has been checked and  approved by the attending provider.

## 2015-09-24 NOTE — Progress Notes (Signed)
Weight and vitals stable. Reports waking daily since last Wednesday with a headache. Reports he was seen by his PCP and instructed to take Tylenol before bed. Reports doing as directed but, knows he can't take tylenol for prolonged periods due to coumadin intake. Denies nocturia, dysuria or hematuria. Reports 2 out of 7 day a scant amount of leakage. Reports urine stream is weaker. Reports occasional difficulty emptying. Reports stool feel firmer but, denies constipation. Denies fatigue. Reports he played 18 holes of golf today.   BP 120/76 mmHg  Pulse 64  Resp 16  Wt 142 lb 3.2 oz (64.501 kg)  SpO2 100% Wt Readings from Last 3 Encounters:  09/24/15 142 lb 3.2 oz (64.501 kg)  09/17/15 145 lb (65.772 kg)  09/10/15 145 lb (65.772 kg)

## 2015-09-27 ENCOUNTER — Ambulatory Visit
Admission: RE | Admit: 2015-09-27 | Discharge: 2015-09-27 | Disposition: A | Discharge status: Home / Self Care | Payer: PPO | Source: Ambulatory Visit | Attending: Radiation Oncology | Admitting: Radiation Oncology

## 2015-09-27 DIAGNOSIS — Z510 Encounter for antineoplastic radiation therapy: Secondary | ICD-10-CM | POA: Diagnosis not present

## 2015-09-27 DIAGNOSIS — C61 Malignant neoplasm of prostate: Secondary | ICD-10-CM | POA: Diagnosis not present

## 2015-09-28 ENCOUNTER — Ambulatory Visit
Admission: RE | Admit: 2015-09-28 | Discharge: 2015-09-28 | Disposition: A | Discharge status: Home / Self Care | Payer: PPO | Source: Ambulatory Visit | Attending: Radiation Oncology | Admitting: Radiation Oncology

## 2015-09-28 DIAGNOSIS — C61 Malignant neoplasm of prostate: Secondary | ICD-10-CM | POA: Diagnosis not present

## 2015-09-28 DIAGNOSIS — Z510 Encounter for antineoplastic radiation therapy: Secondary | ICD-10-CM | POA: Diagnosis not present

## 2015-09-29 ENCOUNTER — Ambulatory Visit
Admission: RE | Admit: 2015-09-29 | Discharge: 2015-09-29 | Disposition: A | Discharge status: Home / Self Care | Payer: PPO | Source: Ambulatory Visit | Attending: Radiation Oncology | Admitting: Radiation Oncology

## 2015-09-29 DIAGNOSIS — Z510 Encounter for antineoplastic radiation therapy: Secondary | ICD-10-CM | POA: Diagnosis not present

## 2015-09-29 DIAGNOSIS — C61 Malignant neoplasm of prostate: Secondary | ICD-10-CM | POA: Diagnosis not present

## 2015-09-30 ENCOUNTER — Encounter: Payer: Self-pay | Admitting: Radiation Oncology

## 2015-09-30 ENCOUNTER — Ambulatory Visit
Admission: RE | Admit: 2015-09-30 | Discharge: 2015-09-30 | Disposition: A | Discharge status: Home / Self Care | Payer: PPO | Source: Ambulatory Visit | Attending: Radiation Oncology | Admitting: Radiation Oncology

## 2015-09-30 DIAGNOSIS — C61 Malignant neoplasm of prostate: Secondary | ICD-10-CM | POA: Diagnosis not present

## 2015-09-30 DIAGNOSIS — Z510 Encounter for antineoplastic radiation therapy: Secondary | ICD-10-CM | POA: Diagnosis not present

## 2015-10-01 ENCOUNTER — Ambulatory Visit
Admission: RE | Admit: 2015-10-01 | Discharge: 2015-10-01 | Disposition: A | Discharge status: Home / Self Care | Payer: PPO | Source: Ambulatory Visit | Attending: Radiation Oncology | Admitting: Radiation Oncology

## 2015-10-01 DIAGNOSIS — Z510 Encounter for antineoplastic radiation therapy: Secondary | ICD-10-CM | POA: Diagnosis not present

## 2015-10-01 DIAGNOSIS — C61 Malignant neoplasm of prostate: Secondary | ICD-10-CM | POA: Diagnosis not present

## 2015-10-04 ENCOUNTER — Ambulatory Visit
Admission: RE | Admit: 2015-10-04 | Discharge: 2015-10-04 | Disposition: A | Discharge status: Home / Self Care | Payer: PPO | Source: Ambulatory Visit | Attending: Radiation Oncology | Admitting: Radiation Oncology

## 2015-10-04 ENCOUNTER — Encounter: Payer: Self-pay | Admitting: Radiation Oncology

## 2015-10-04 VITALS — BP 143/74 | HR 58 | Resp 18 | Wt 146.3 lb

## 2015-10-04 DIAGNOSIS — Z510 Encounter for antineoplastic radiation therapy: Secondary | ICD-10-CM | POA: Diagnosis not present

## 2015-10-04 DIAGNOSIS — C61 Malignant neoplasm of prostate: Secondary | ICD-10-CM

## 2015-10-04 NOTE — Progress Notes (Signed)
Weight and vitals stable. Denies pain. Denies nocturia, dysuria or hematuria. Reports occasional leakage continues. Describes a weak urine stream. Reports occasional difficulty emptying his bladder. Reports constipation x 2 days. Reports he plans to take ex lax tonight to relieve constipation. Denies fatigue. Patient hoping to complete XRT on Thursday because he scheduled to leave for [place] on Friday. Questions if a BID treatment is appropriate. Patient denies having an appointment scheduled to follow up with his urologist. One month follow up appointment card given. Patient understands to contact this RN with future needs.  BP (!) 143/74 (BP Location: Left Arm, Patient Position: Sitting, Cuff Size: Normal)   Pulse (!) 58   Resp 18   Wt 146 lb 4.8 oz (66.4 kg)   BMI 23.61 kg/m  Wt Readings from Last 3 Encounters:  10/04/15 146 lb 4.8 oz (66.4 kg)  09/24/15 142 lb 3.2 oz (64.5 kg)  09/17/15 145 lb (65.8 kg)

## 2015-10-04 NOTE — Progress Notes (Signed)
  Radiation Oncology         [phone]   Name: Glenn Logan MRN: [id number]   Date: 10/04/2015  DOB: [birth year]     Weekly Radiation Therapy Management    ICD-9-CM ICD-10-CM   1. Malignant neoplasm of prostate ([place]) 185 C61     Current Dose: 61.2 Gy  Planned Dose:  68.4 Gy  Narrative The patient presents for routine under treatment assessment.  Progress Notes Date of Service: 10/04/2015 3:16 PM Heywood Footman, RN  Radiation Oncology    [] Hide copied text [x] Hover for attribution information Weight  and vitals stable. Denies  pain. Denies nocturia, dysuria or hematuria. Reports occasional leakage continues. Describes a weak urine stream. Reports occasional difficulty emptying his  bladder. Reports constipation x 2 days. Reports he plans to take ex lax tonight to relieve constipation. Denies fatigue. Patient hoping to complete  XRT on Thursday because he scheduled to leave for [place] on Friday. Questions if a BID treatment is  appropriate. Patient denies having an appointment scheduled to  follow up with his urologist. One month follow up appointment card given. Patient understands to contact this RN with future needs.  BP (!) 143/74 (BP Location: Left Arm, Patient Position: Sitting, Cuff Size: Normal)   Pulse (!) 58   Resp 18   Wt 146 lb 4.8 oz (66.4 kg)   BMI 23.61 kg/m     Wt Readings from Last 3 Encounters:  10/04/15 146 lb 4.8 oz (66.4 kg)  09/24/15 142 lb 3.2 oz (64.5 kg)  09/17/15 145 lb (65.8 kg)          WEEKLY UNDER TREATMENT CHECK on 10/04/2015        Detailed Report       Set-up films were reviewed. The chart was checked.  Physical Findings  weight is 146 lb 4.8 oz (66.4 kg). His blood pressure is 143/74 (abnormal) and his pulse is 58 (abnormal). His respiration is 18. . Weight essentially stable.  No significant changes. Normal BP noted.  Impression The patient is tolerating radiation.  Plan Continue treatment as planned.          Sheral Apley Tammi Klippel, [name]  This document serves as a record of services personally performed by Tyler Pita, MD. It was created on his behalf by Darcus Austin, a trained medical scribe. The creation of this record is based on the scribe's personal observations and the provider's statements to them. This document has been checked and approved by the attending provider.

## 2015-10-05 ENCOUNTER — Ambulatory Visit
Admission: RE | Admit: 2015-10-05 | Discharge: 2015-10-05 | Disposition: A | Discharge status: Home / Self Care | Payer: PPO | Source: Ambulatory Visit | Attending: Radiation Oncology | Admitting: Radiation Oncology

## 2015-10-05 DIAGNOSIS — C61 Malignant neoplasm of prostate: Secondary | ICD-10-CM | POA: Diagnosis not present

## 2015-10-05 DIAGNOSIS — Z510 Encounter for antineoplastic radiation therapy: Secondary | ICD-10-CM | POA: Diagnosis not present

## 2015-10-06 ENCOUNTER — Ambulatory Visit
Admission: RE | Admit: 2015-10-06 | Discharge: 2015-10-06 | Disposition: A | Discharge status: Home / Self Care | Payer: PPO | Source: Ambulatory Visit | Attending: Radiation Oncology | Admitting: Radiation Oncology

## 2015-10-06 DIAGNOSIS — C61 Malignant neoplasm of prostate: Secondary | ICD-10-CM | POA: Diagnosis not present

## 2015-10-06 DIAGNOSIS — Z510 Encounter for antineoplastic radiation therapy: Secondary | ICD-10-CM | POA: Diagnosis not present

## 2015-10-07 ENCOUNTER — Ambulatory Visit
Admission: RE | Admit: 2015-10-07 | Discharge: 2015-10-07 | Disposition: A | Discharge status: Home / Self Care | Payer: PPO | Source: Ambulatory Visit | Attending: Radiation Oncology | Admitting: Radiation Oncology

## 2015-10-07 DIAGNOSIS — C61 Malignant neoplasm of prostate: Secondary | ICD-10-CM | POA: Diagnosis not present

## 2015-10-07 DIAGNOSIS — Z510 Encounter for antineoplastic radiation therapy: Secondary | ICD-10-CM | POA: Diagnosis not present

## 2015-10-08 ENCOUNTER — Ambulatory Visit
Admission: RE | Admit: 2015-10-08 | Discharge: 2015-10-08 | Disposition: A | Discharge status: Home / Self Care | Payer: PPO | Source: Ambulatory Visit | Attending: Radiation Oncology | Admitting: Radiation Oncology

## 2015-10-08 ENCOUNTER — Encounter: Payer: Self-pay | Admitting: Radiation Oncology

## 2015-10-08 VITALS — BP 134/74 | HR 60 | Resp 16 | Wt 145.2 lb

## 2015-10-08 DIAGNOSIS — C61 Malignant neoplasm of prostate: Secondary | ICD-10-CM | POA: Diagnosis not present

## 2015-10-08 DIAGNOSIS — Z510 Encounter for antineoplastic radiation therapy: Secondary | ICD-10-CM | POA: Diagnosis not present

## 2015-10-08 NOTE — Progress Notes (Signed)
  Radiation Oncology         [phone]   Name: Glenn Logan MRN: [id number]   Date: 10/08/2015  DOB: 07/26/1945     Weekly Radiation Therapy Management    ICD-9-CM ICD-10-CM   1. Malignant neoplasm of prostate ([place]) 185 C61     Current Dose: 68.4 Gy  Planned Dose:  68.4 Gy  Narrative The patient presents for routine under treatment assessment.  Weight and vitals stable. Denies pain. Denies nocturia, dysuria or hematuria. Reports occasional leakage continues. Describes a weak urine stream. Reports occasional difficulty emptying his bladder. Denies diarrhea. One month follow up appointment card given. Understands to contact this RN with future needs. Denies having an appointment with his urologist yet.     Set-up films were reviewed. The chart was checked.  Physical Findings  weight is 145 lb 3.2 oz (65.9 kg). His blood pressure is 134/74 and his pulse is 60. His respiration is 16 and oxygen saturation is 100%. . Weight essentially stable.  No significant changes. Normal BP noted.  Impression The patient has tolerated radiotherapy.   Plan The patient will follow up with Dr. Tammi Klippel in one month for follow up.          Sheral Apley Tammi Klippel, [name]  This document serves as a record of services personally performed by Tyler Pita, MD. It was created on his behalf by Truddie Hidden, a trained medical scribe. The creation of this record is based on the scribe's personal observations and the provider's statements to them. This document has been checked and approved by the attending provider.

## 2015-10-08 NOTE — Progress Notes (Addendum)
Weight and vitals stable. Denies pain. Denies nocturia, dysuria or hematuria. Reports occasional leakage continues. Describes a weak urine stream. Reports occasional difficulty emptying his bladder. Denies diarrhea. One month follow up appointment card given. Understands to contact this RN with future needs. Denies having an appointment with his urologist yet.  BP 134/74 (BP Location: Left Arm, Patient Position: Sitting, Cuff Size: Normal)   Pulse 60   Resp 16   Wt 145 lb 3.2 oz (65.9 kg)   SpO2 100%   BMI 23.44 kg/m  Wt Readings from Last 3 Encounters:  10/08/15 145 lb 3.2 oz (65.9 kg)  10/04/15 146 lb 4.8 oz (66.4 kg)  09/24/15 142 lb 3.2 oz (64.5 kg)

## 2015-10-11 NOTE — Progress Notes (Signed)
  Radiation Oncology         [phone]) [phone] ________________________________  Name: Glenn Logan MRN: [id number]  Date: 10/08/2015  DOB: 09/17/1945  End of Treatment Note  Diagnosis: 70 y.o. gentleman with with stage T3b adenocarcinoma of the prostate with a Gleason's score of 5+4 and a post-prostatectomy PSA of 0.26  Indication for treatment:  Curative, Prostatic Fossa Radiotherapy       Radiation treatment dates: 08/17/15 - 10/08/15  Site/dose:   The prostatic fossa was treated to 68.4 Gy in 38 fractions of 1.8 Gy  Beams/energy:   The prostatic fossa was treated using helical intensity modulated radiotherapy delivering 6 megavolt photons. Image guidance was performed with megavoltage CT studies prior to each fraction. He was immobilized with a body fix lower extremity mold.  Narrative: The patient tolerated radiation treatment relatively well. He reports occasional leakage continuing, a weak urine stream, and occasional difficulty emptying his bladder.  Plan: The patient has completed radiation treatment. He will return to radiation oncology clinic for routine followup in one month. I advised him to call or return sooner if he has any questions or concerns related to his recovery or treatment. ________________________________  Sheral Apley. Tammi Klippel, [name]  This document serves as a record of services personally performed by Tyler Pita, MD. It was created on his behalf by Darcus Austin, a trained medical scribe. The creation of this record is based on the scribe's personal observations and the provider's statements to them. This document has been checked and approved by the attending provider.

## 2015-11-11 ENCOUNTER — Ambulatory Visit: Payer: Self-pay | Admitting: Radiation Oncology

## 2015-11-29 ENCOUNTER — Encounter: Payer: Self-pay | Admitting: Radiation Oncology

## 2015-11-29 ENCOUNTER — Ambulatory Visit
Admission: RE | Admit: 2015-11-29 | Discharge: 2015-11-29 | Disposition: A | Discharge status: Home / Self Care | Payer: PPO | Source: Ambulatory Visit | Attending: Radiation Oncology | Admitting: Radiation Oncology

## 2015-11-29 VITALS — BP 126/57 | HR 66 | Temp 98.0°F | Resp 18 | Ht 66.0 in | Wt 148.0 lb

## 2015-11-29 DIAGNOSIS — Z880 Allergy status to penicillin: Secondary | ICD-10-CM | POA: Diagnosis not present

## 2015-11-29 DIAGNOSIS — Z7901 Long term (current) use of anticoagulants: Secondary | ICD-10-CM | POA: Diagnosis not present

## 2015-11-29 DIAGNOSIS — C61 Malignant neoplasm of prostate: Secondary | ICD-10-CM

## 2015-11-29 DIAGNOSIS — Z79899 Other long term (current) drug therapy: Secondary | ICD-10-CM | POA: Diagnosis not present

## 2015-11-29 NOTE — Progress Notes (Signed)
Weight and vitals stable. Denies pain. Denies nocturia, dysuria or hematuria. Reports occasional leakage continues. Describes a strong urine stream. Reports occasional difficulty emptying his bladder. Denies diarrhea.  Denies having an appointment with his urologist yet. Wt Readings from Last 3 Encounters:  11/29/15 148 lb (67.1 kg)  10/08/15 145 lb 3.2 oz (65.9 kg)  10/04/15 146 lb 4.8 oz (66.4 kg)  BP (!) 126/57 (BP Location: Right Arm, Patient Position: Sitting, Cuff Size: Normal)   Pulse 66   Temp 98 F (36.7 C) (Oral)   Resp 18   Ht 5\' 6"  (1.676 m)   Wt 148 lb (67.1 kg)   SpO2 98%   BMI 23.89 kg/m  .

## 2015-11-29 NOTE — Addendum Note (Signed)
Encounter addended by: Benn Moulder, RN on: 11/29/2015  5:13 PM<BR>    Actions taken: Charge Capture section accepted

## 2015-11-29 NOTE — Progress Notes (Signed)
Radiation Oncology         [phone] ________________________________  Name: Glenn Logan MRN: [id number]  Date: 11/29/2015  DOB: 06-05-1945  Post Treatment Note  CC: Rachell Cipro, MD  Darrol Poke, MD  Diagnosis: 70 y.o. gentleman with with stage T3b adenocarcinoma of the prostate with a Gleason's score of 5+4 and a post-prostatectomy PSA of 0.26  Interval Since Last Radiation:  7 weeks   08/17/15 - 10/08/15: The prostatic fossa was treated to 68.4 Gy in 38 fractions of 1.8 Gy  Narrative:  The patient returns today for routine follow-up.  He tolerated radiotherapy well without significant side effects.                            On review of systems, the patient states he is doing well. He reports occasional urgency with urination but denies any hesitancy, incomplete voiding, dysuria, fevers, chills, chest pain, shortness of breath, or back pain. No other complaints are verbalized.  ALLERGIES:  is allergic to penicillins; sulfa antibiotics; and amoxicillin.  Meds: Current Outpatient Prescriptions  Medication Sig Dispense Refill  . acetaminophen (TYLENOL) 325 MG tablet Take 650 mg by mouth every 6 (six) hours as needed.    [name] albuterol (ACCUNEB) 0.63 MG/3ML nebulizer solution Take 1 ampule by nebulization every 6 (six) hours as needed for wheezing or shortness of breath.     . budesonide-formoterol (SYMBICORT) 160-4.5 MCG/ACT inhaler Inhale 2 puffs into the lungs 2 (two) times daily as needed (shortness of breath).     . cetirizine (ZYRTEC) 10 MG tablet Take by mouth.    . finasteride (PROSCAR) 5 MG tablet Take by mouth.    . fluticasone (FLONASE) 50 MCG/ACT nasal spray Place 2 sprays into both nostrils daily.    [name] glyBURIDE (DIABETA) 1.25 MG tablet Take by mouth.    . losartan (COZAAR) 100 MG tablet Take by mouth.    . Multiple Vitamin (MULTIVITAMIN WITH MINERALS) TABS tablet Take 1 tablet by mouth daily.    . Nebulizers (HEALTHY LIVING COMPRESSOR/NEB) DEVI Use.      . polyethylene glycol (MIRALAX / GLYCOLAX) packet Take 17 g by mouth daily. 14 each 0  . Vitamins/Minerals TABS Take by mouth.    . warfarin (COUMADIN) 5 MG tablet Take by mouth.    . zolpidem (AMBIEN) 5 MG tablet Take by mouth.    . methocarbamol (ROBAXIN) 500 MG tablet Take 1 tablet (500 mg total) by mouth 4 (four) times daily. (Patient not taking: Reported on 11/29/2015) 28 tablet 0  . ondansetron (ZOFRAN-ODT) 4 MG disintegrating tablet Take by mouth.     No current facility-administered medications for this encounter.     Physical Findings:  height is 5\' 6"  (1.676 m) and weight is 148 lb (67.1 kg). His oral temperature is 98 F (36.7 C). His blood pressure is 126/57 (abnormal) and his pulse is 66. His respiration is 18 and oxygen saturation is 98%.  In general this is a well appearing African American male in no acute distress. He's alert and oriented x4 and appropriate throughout the examination. Cardiopulmonary assessment is negative for acute distress and he exhibits normal effort.   Lab Findings: Lab Results  Component Value Date   WBC 11.0 (H) 01/08/2015   HGB 12.0 (L) 01/08/2015   HCT 37.0 (L) 01/08/2015   MCV 91.4 01/08/2015   PLT 213 01/08/2015     Radiographic Findings: No results found.  Impression/Plan: 1. 69  y.o. gentleman with with stage T3b adenocarcinoma of the prostate with a Gleason's score of 5+4 and a post-prostatectomy PSA of 0.26. The patient is doing well overall. He continues to have some urgency when urinating which should improve with time. He does not have any scheduled follow up with urology in [place] through the [place]. We will contact his PCP to request a referral to [facility] so he can have follow up and a PSA level checked in 2 months, as he does not wish to travel back and forth to [facility]  2. Survivorship. The patient is interested in receiving a post treatment care plan but is not interested in a face to face visit. I will communicate with  Mike Craze, NP regarding this.      Carola Rhine, PAC

## 2016-01-12 DIAGNOSIS — Z23 Encounter for immunization: Secondary | ICD-10-CM | POA: Diagnosis not present

## 2016-02-14 DIAGNOSIS — J069 Acute upper respiratory infection, unspecified: Secondary | ICD-10-CM | POA: Diagnosis not present

## 2016-03-23 ENCOUNTER — Encounter: Payer: Self-pay | Admitting: Gastroenterology

## 2016-04-29 DIAGNOSIS — J029 Acute pharyngitis, unspecified: Secondary | ICD-10-CM | POA: Diagnosis not present

## 2016-04-29 DIAGNOSIS — R69 Illness, unspecified: Secondary | ICD-10-CM | POA: Diagnosis not present

## 2016-09-13 ENCOUNTER — Emergency Department (HOSPITAL_COMMUNITY): Payer: PPO

## 2016-09-13 ENCOUNTER — Emergency Department (HOSPITAL_COMMUNITY)
Admission: EM | Admit: 2016-09-13 | Discharge: 2016-09-13 | Disposition: A | Discharge status: Home / Self Care | Payer: PPO | Attending: Emergency Medicine | Admitting: Emergency Medicine

## 2016-09-13 ENCOUNTER — Encounter (HOSPITAL_COMMUNITY): Payer: Self-pay | Admitting: Emergency Medicine

## 2016-09-13 DIAGNOSIS — S161XXA Strain of muscle, fascia and tendon at neck level, initial encounter: Secondary | ICD-10-CM | POA: Diagnosis not present

## 2016-09-13 DIAGNOSIS — E119 Type 2 diabetes mellitus without complications: Secondary | ICD-10-CM | POA: Insufficient documentation

## 2016-09-13 DIAGNOSIS — Z79899 Other long term (current) drug therapy: Secondary | ICD-10-CM | POA: Diagnosis not present

## 2016-09-13 DIAGNOSIS — I10 Essential (primary) hypertension: Secondary | ICD-10-CM | POA: Diagnosis not present

## 2016-09-13 DIAGNOSIS — Z8546 Personal history of malignant neoplasm of prostate: Secondary | ICD-10-CM | POA: Diagnosis not present

## 2016-09-13 DIAGNOSIS — Y999 Unspecified external cause status: Secondary | ICD-10-CM | POA: Diagnosis not present

## 2016-09-13 DIAGNOSIS — R51 Headache: Secondary | ICD-10-CM | POA: Diagnosis not present

## 2016-09-13 DIAGNOSIS — M545 Low back pain: Secondary | ICD-10-CM | POA: Diagnosis not present

## 2016-09-13 DIAGNOSIS — Z86718 Personal history of other venous thrombosis and embolism: Secondary | ICD-10-CM | POA: Insufficient documentation

## 2016-09-13 DIAGNOSIS — S0990XA Unspecified injury of head, initial encounter: Secondary | ICD-10-CM | POA: Diagnosis not present

## 2016-09-13 DIAGNOSIS — S139XXA Sprain of joints and ligaments of unspecified parts of neck, initial encounter: Secondary | ICD-10-CM

## 2016-09-13 DIAGNOSIS — Y939 Activity, unspecified: Secondary | ICD-10-CM | POA: Diagnosis not present

## 2016-09-13 DIAGNOSIS — S39012A Strain of muscle, fascia and tendon of lower back, initial encounter: Secondary | ICD-10-CM

## 2016-09-13 DIAGNOSIS — Y929 Unspecified place or not applicable: Secondary | ICD-10-CM | POA: Diagnosis not present

## 2016-09-13 DIAGNOSIS — Z7901 Long term (current) use of anticoagulants: Secondary | ICD-10-CM | POA: Diagnosis not present

## 2016-09-13 DIAGNOSIS — S199XXA Unspecified injury of neck, initial encounter: Secondary | ICD-10-CM | POA: Diagnosis not present

## 2016-09-13 DIAGNOSIS — M542 Cervicalgia: Secondary | ICD-10-CM | POA: Diagnosis not present

## 2016-09-13 MED ORDER — NAPROXEN 500 MG PO TABS
500.0000 mg | ORAL_TABLET | Freq: Once | ORAL | Status: AC
  Administered 2016-09-13: 500 mg via ORAL
  Filled 2016-09-13: qty 1

## 2016-09-13 MED ORDER — METHOCARBAMOL 500 MG PO TABS: 500.0000 mg | ORAL_TABLET | Freq: Three times a day (TID) | ORAL | 0 refills | Status: DC | PRN

## 2016-09-13 MED ORDER — HYDROCODONE-ACETAMINOPHEN 5-325 MG PO TABS: 1.0000 | ORAL_TABLET | Freq: Four times a day (QID) | ORAL | 0 refills | Status: DC | PRN
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
## 2016-09-13 NOTE — ED Notes (Signed)
Pt walks with steady gait in hallway independently.
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
## 2016-09-19 DIAGNOSIS — M542 Cervicalgia: Secondary | ICD-10-CM | POA: Diagnosis not present

## 2016-09-19 DIAGNOSIS — Z6823 Body mass index (BMI) 23.0-23.9, adult: Secondary | ICD-10-CM | POA: Diagnosis not present

## 2016-09-19 DIAGNOSIS — M545 Low back pain: Secondary | ICD-10-CM | POA: Diagnosis not present

## 2016-10-30 DIAGNOSIS — M549 Dorsalgia, unspecified: Secondary | ICD-10-CM | POA: Diagnosis not present

## 2016-10-30 DIAGNOSIS — M542 Cervicalgia: Secondary | ICD-10-CM | POA: Diagnosis not present

## 2016-12-04 DIAGNOSIS — M549 Dorsalgia, unspecified: Secondary | ICD-10-CM | POA: Diagnosis not present

## 2016-12-04 DIAGNOSIS — Z6824 Body mass index (BMI) 24.0-24.9, adult: Secondary | ICD-10-CM | POA: Diagnosis not present

## 2017-07-19 DIAGNOSIS — Z6823 Body mass index (BMI) 23.0-23.9, adult: Secondary | ICD-10-CM | POA: Diagnosis not present

## 2017-07-19 DIAGNOSIS — J309 Allergic rhinitis, unspecified: Secondary | ICD-10-CM | POA: Diagnosis not present

## 2017-09-21 DIAGNOSIS — Z882 Allergy status to sulfonamides status: Secondary | ICD-10-CM | POA: Diagnosis not present

## 2017-09-21 DIAGNOSIS — R0602 Shortness of breath: Secondary | ICD-10-CM | POA: Diagnosis not present

## 2017-09-21 DIAGNOSIS — J441 Chronic obstructive pulmonary disease with (acute) exacerbation: Secondary | ICD-10-CM | POA: Diagnosis not present

## 2017-09-21 DIAGNOSIS — E119 Type 2 diabetes mellitus without complications: Secondary | ICD-10-CM | POA: Diagnosis not present

## 2017-09-21 DIAGNOSIS — Z880 Allergy status to penicillin: Secondary | ICD-10-CM | POA: Diagnosis not present

## 2017-09-21 DIAGNOSIS — Z87891 Personal history of nicotine dependence: Secondary | ICD-10-CM | POA: Diagnosis not present

## 2017-09-21 DIAGNOSIS — Z79899 Other long term (current) drug therapy: Secondary | ICD-10-CM | POA: Diagnosis not present

## 2017-09-21 DIAGNOSIS — Z7901 Long term (current) use of anticoagulants: Secondary | ICD-10-CM | POA: Diagnosis not present

## 2017-09-21 DIAGNOSIS — Z87892 Personal history of anaphylaxis: Secondary | ICD-10-CM | POA: Diagnosis not present

## 2017-09-21 DIAGNOSIS — Z7951 Long term (current) use of inhaled steroids: Secondary | ICD-10-CM | POA: Diagnosis not present

## 2017-09-21 DIAGNOSIS — Z7984 Long term (current) use of oral hypoglycemic drugs: Secondary | ICD-10-CM | POA: Diagnosis not present

## 2017-09-21 DIAGNOSIS — R0600 Dyspnea, unspecified: Secondary | ICD-10-CM | POA: Diagnosis not present

## 2017-09-21 DIAGNOSIS — I10 Essential (primary) hypertension: Secondary | ICD-10-CM | POA: Diagnosis not present

## 2017-09-21 DIAGNOSIS — Z86718 Personal history of other venous thrombosis and embolism: Secondary | ICD-10-CM | POA: Diagnosis not present

## 2018-06-28 DIAGNOSIS — L0291 Cutaneous abscess, unspecified: Secondary | ICD-10-CM | POA: Diagnosis not present

## 2018-06-28 DIAGNOSIS — J309 Allergic rhinitis, unspecified: Secondary | ICD-10-CM | POA: Diagnosis not present

## 2018-06-28 DIAGNOSIS — I10 Essential (primary) hypertension: Secondary | ICD-10-CM | POA: Diagnosis not present

## 2018-06-28 DIAGNOSIS — Z719 Counseling, unspecified: Secondary | ICD-10-CM | POA: Diagnosis not present

## 2018-07-01 DIAGNOSIS — I10 Essential (primary) hypertension: Secondary | ICD-10-CM | POA: Diagnosis not present

## 2018-07-01 DIAGNOSIS — L0291 Cutaneous abscess, unspecified: Secondary | ICD-10-CM | POA: Diagnosis not present

## 2018-07-01 DIAGNOSIS — Z719 Counseling, unspecified: Secondary | ICD-10-CM | POA: Diagnosis not present

## 2018-07-29 DIAGNOSIS — J45909 Unspecified asthma, uncomplicated: Secondary | ICD-10-CM | POA: Diagnosis not present

## 2018-07-29 DIAGNOSIS — J309 Allergic rhinitis, unspecified: Secondary | ICD-10-CM | POA: Diagnosis not present

## 2018-07-29 DIAGNOSIS — I10 Essential (primary) hypertension: Secondary | ICD-10-CM | POA: Diagnosis not present

## 2018-08-19 DIAGNOSIS — J309 Allergic rhinitis, unspecified: Secondary | ICD-10-CM | POA: Diagnosis not present

## 2018-08-19 DIAGNOSIS — J45909 Unspecified asthma, uncomplicated: Secondary | ICD-10-CM | POA: Diagnosis not present

## 2018-08-19 DIAGNOSIS — F329 Major depressive disorder, single episode, unspecified: Secondary | ICD-10-CM | POA: Diagnosis not present

## 2018-08-19 DIAGNOSIS — I10 Essential (primary) hypertension: Secondary | ICD-10-CM | POA: Diagnosis not present

## 2018-08-19 DIAGNOSIS — E119 Type 2 diabetes mellitus without complications: Secondary | ICD-10-CM | POA: Diagnosis not present

## 2018-09-23 DIAGNOSIS — Z0000 Encounter for general adult medical examination without abnormal findings: Secondary | ICD-10-CM | POA: Diagnosis not present

## 2019-01-09 DIAGNOSIS — K602 Anal fissure, unspecified: Secondary | ICD-10-CM | POA: Diagnosis not present

## 2019-01-09 DIAGNOSIS — K5900 Constipation, unspecified: Secondary | ICD-10-CM | POA: Diagnosis not present

## 2019-01-09 DIAGNOSIS — R319 Hematuria, unspecified: Secondary | ICD-10-CM | POA: Diagnosis not present

## 2019-02-03 DIAGNOSIS — K602 Anal fissure, unspecified: Secondary | ICD-10-CM | POA: Diagnosis not present

## 2019-02-03 DIAGNOSIS — J309 Allergic rhinitis, unspecified: Secondary | ICD-10-CM | POA: Diagnosis not present

## 2019-02-03 DIAGNOSIS — K5900 Constipation, unspecified: Secondary | ICD-10-CM | POA: Diagnosis not present

## 2019-03-17 DIAGNOSIS — K5900 Constipation, unspecified: Secondary | ICD-10-CM | POA: Diagnosis not present

## 2019-04-22 ENCOUNTER — Emergency Department (HOSPITAL_COMMUNITY): Payer: PPO

## 2019-04-22 ENCOUNTER — Emergency Department (HOSPITAL_COMMUNITY)
Admission: EM | Admit: 2019-04-22 | Discharge: 2019-04-22 | Disposition: A | Discharge status: Home / Self Care | Payer: PPO | Attending: Emergency Medicine | Admitting: Emergency Medicine

## 2019-04-22 ENCOUNTER — Other Ambulatory Visit: Payer: Self-pay

## 2019-04-22 DIAGNOSIS — M25539 Pain in unspecified wrist: Secondary | ICD-10-CM | POA: Diagnosis not present

## 2019-04-22 DIAGNOSIS — R519 Headache, unspecified: Secondary | ICD-10-CM | POA: Diagnosis not present

## 2019-04-22 DIAGNOSIS — M542 Cervicalgia: Secondary | ICD-10-CM | POA: Diagnosis not present

## 2019-04-22 DIAGNOSIS — Y92410 Unspecified street and highway as the place of occurrence of the external cause: Secondary | ICD-10-CM | POA: Insufficient documentation

## 2019-04-22 DIAGNOSIS — S6991XA Unspecified injury of right wrist, hand and finger(s), initial encounter: Secondary | ICD-10-CM | POA: Diagnosis not present

## 2019-04-22 DIAGNOSIS — Y999 Unspecified external cause status: Secondary | ICD-10-CM | POA: Diagnosis not present

## 2019-04-22 DIAGNOSIS — Z87891 Personal history of nicotine dependence: Secondary | ICD-10-CM | POA: Insufficient documentation

## 2019-04-22 DIAGNOSIS — Y93I9 Activity, other involving external motion: Secondary | ICD-10-CM | POA: Insufficient documentation

## 2019-04-22 DIAGNOSIS — I10 Essential (primary) hypertension: Secondary | ICD-10-CM | POA: Insufficient documentation

## 2019-04-22 DIAGNOSIS — M25511 Pain in right shoulder: Secondary | ICD-10-CM | POA: Insufficient documentation

## 2019-04-22 DIAGNOSIS — S199XXA Unspecified injury of neck, initial encounter: Secondary | ICD-10-CM | POA: Diagnosis not present

## 2019-04-22 DIAGNOSIS — Z79899 Other long term (current) drug therapy: Secondary | ICD-10-CM | POA: Insufficient documentation

## 2019-04-22 DIAGNOSIS — R52 Pain, unspecified: Secondary | ICD-10-CM | POA: Diagnosis not present

## 2019-04-22 DIAGNOSIS — E119 Type 2 diabetes mellitus without complications: Secondary | ICD-10-CM | POA: Diagnosis not present

## 2019-04-22 DIAGNOSIS — M25531 Pain in right wrist: Secondary | ICD-10-CM | POA: Insufficient documentation

## 2019-04-22 DIAGNOSIS — S4991XA Unspecified injury of right shoulder and upper arm, initial encounter: Secondary | ICD-10-CM | POA: Diagnosis not present

## 2019-04-22 MED ORDER — OXYCODONE-ACETAMINOPHEN 5-325 MG PO TABS: 1.0000 | ORAL_TABLET | Freq: Four times a day (QID) | ORAL | 0 refills | Status: DC | PRN

## 2019-04-22 MED ORDER — HYDROCODONE-ACETAMINOPHEN 5-325 MG PO TABS
1.0000 | ORAL_TABLET | Freq: Once | ORAL | Status: AC
  Administered 2019-04-22: 21:00:00 1 via ORAL
  Filled 2019-04-22: qty 1

## 2019-04-22 NOTE — Discharge Instructions (Addendum)
Follow-up with your family doctor next week for recheck. 
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
## 2019-05-02 DIAGNOSIS — T148XXD Other injury of unspecified body region, subsequent encounter: Secondary | ICD-10-CM | POA: Diagnosis not present

## 2019-05-02 DIAGNOSIS — T50905A Adverse effect of unspecified drugs, medicaments and biological substances, initial encounter: Secondary | ICD-10-CM | POA: Diagnosis not present

## 2019-05-02 DIAGNOSIS — M542 Cervicalgia: Secondary | ICD-10-CM | POA: Diagnosis not present

## 2019-05-02 DIAGNOSIS — M549 Dorsalgia, unspecified: Secondary | ICD-10-CM | POA: Diagnosis not present

## 2019-05-12 DIAGNOSIS — M545 Low back pain: Secondary | ICD-10-CM | POA: Diagnosis not present

## 2019-05-12 DIAGNOSIS — R519 Headache, unspecified: Secondary | ICD-10-CM | POA: Diagnosis not present

## 2019-05-12 DIAGNOSIS — M542 Cervicalgia: Secondary | ICD-10-CM | POA: Diagnosis not present

## 2019-05-16 DIAGNOSIS — R519 Headache, unspecified: Secondary | ICD-10-CM | POA: Diagnosis not present

## 2019-05-16 DIAGNOSIS — M542 Cervicalgia: Secondary | ICD-10-CM | POA: Diagnosis not present

## 2019-05-16 DIAGNOSIS — M545 Low back pain: Secondary | ICD-10-CM | POA: Diagnosis not present

## 2019-05-19 DIAGNOSIS — R519 Headache, unspecified: Secondary | ICD-10-CM | POA: Diagnosis not present

## 2019-05-19 DIAGNOSIS — M542 Cervicalgia: Secondary | ICD-10-CM | POA: Diagnosis not present

## 2019-05-19 DIAGNOSIS — M549 Dorsalgia, unspecified: Secondary | ICD-10-CM | POA: Diagnosis not present

## 2019-05-19 DIAGNOSIS — T148XXD Other injury of unspecified body region, subsequent encounter: Secondary | ICD-10-CM | POA: Diagnosis not present

## 2019-05-19 DIAGNOSIS — M545 Low back pain: Secondary | ICD-10-CM | POA: Diagnosis not present

## 2019-05-21 DIAGNOSIS — M542 Cervicalgia: Secondary | ICD-10-CM | POA: Diagnosis not present

## 2019-05-21 DIAGNOSIS — R519 Headache, unspecified: Secondary | ICD-10-CM | POA: Diagnosis not present

## 2019-05-21 DIAGNOSIS — M545 Low back pain: Secondary | ICD-10-CM | POA: Diagnosis not present

## 2019-05-28 DIAGNOSIS — M545 Low back pain: Secondary | ICD-10-CM | POA: Diagnosis not present

## 2019-05-28 DIAGNOSIS — M542 Cervicalgia: Secondary | ICD-10-CM | POA: Diagnosis not present

## 2019-05-28 DIAGNOSIS — R519 Headache, unspecified: Secondary | ICD-10-CM | POA: Diagnosis not present

## 2019-06-04 DIAGNOSIS — M545 Low back pain: Secondary | ICD-10-CM | POA: Diagnosis not present

## 2019-06-04 DIAGNOSIS — M542 Cervicalgia: Secondary | ICD-10-CM | POA: Diagnosis not present

## 2019-06-04 DIAGNOSIS — R519 Headache, unspecified: Secondary | ICD-10-CM | POA: Diagnosis not present

## 2019-06-13 DIAGNOSIS — M545 Low back pain: Secondary | ICD-10-CM | POA: Diagnosis not present

## 2019-06-13 DIAGNOSIS — R519 Headache, unspecified: Secondary | ICD-10-CM | POA: Diagnosis not present

## 2019-06-13 DIAGNOSIS — M542 Cervicalgia: Secondary | ICD-10-CM | POA: Diagnosis not present

## 2019-06-16 DIAGNOSIS — E119 Type 2 diabetes mellitus without complications: Secondary | ICD-10-CM | POA: Diagnosis not present

## 2019-06-16 DIAGNOSIS — F411 Generalized anxiety disorder: Secondary | ICD-10-CM | POA: Diagnosis not present

## 2019-06-16 DIAGNOSIS — I10 Essential (primary) hypertension: Secondary | ICD-10-CM | POA: Diagnosis not present

## 2019-06-23 DIAGNOSIS — I10 Essential (primary) hypertension: Secondary | ICD-10-CM | POA: Diagnosis not present

## 2019-06-23 DIAGNOSIS — Z6824 Body mass index (BMI) 24.0-24.9, adult: Secondary | ICD-10-CM | POA: Diagnosis not present

## 2019-06-23 DIAGNOSIS — M542 Cervicalgia: Secondary | ICD-10-CM | POA: Diagnosis not present

## 2019-06-23 DIAGNOSIS — M549 Dorsalgia, unspecified: Secondary | ICD-10-CM | POA: Diagnosis not present

## 2019-11-23 DIAGNOSIS — Z20822 Contact with and (suspected) exposure to covid-19: Secondary | ICD-10-CM | POA: Diagnosis not present

## 2019-12-02 ENCOUNTER — Other Ambulatory Visit: Payer: Self-pay

## 2019-12-02 ENCOUNTER — Encounter (HOSPITAL_COMMUNITY): Payer: Self-pay | Admitting: Emergency Medicine

## 2019-12-02 ENCOUNTER — Emergency Department (HOSPITAL_COMMUNITY)
Admission: EM | Admit: 2019-12-02 | Discharge: 2019-12-02 | Disposition: A | Discharge status: Home / Self Care | Payer: PPO | Source: Home / Self Care | Attending: Emergency Medicine | Admitting: Emergency Medicine

## 2019-12-02 DIAGNOSIS — E119 Type 2 diabetes mellitus without complications: Secondary | ICD-10-CM | POA: Diagnosis not present

## 2019-12-02 DIAGNOSIS — R310 Gross hematuria: Secondary | ICD-10-CM | POA: Diagnosis not present

## 2019-12-02 DIAGNOSIS — Z86718 Personal history of other venous thrombosis and embolism: Secondary | ICD-10-CM | POA: Diagnosis not present

## 2019-12-02 DIAGNOSIS — Z7984 Long term (current) use of oral hypoglycemic drugs: Secondary | ICD-10-CM | POA: Diagnosis not present

## 2019-12-02 DIAGNOSIS — D72829 Elevated white blood cell count, unspecified: Secondary | ICD-10-CM | POA: Diagnosis not present

## 2019-12-02 DIAGNOSIS — D62 Acute posthemorrhagic anemia: Secondary | ICD-10-CM | POA: Diagnosis present

## 2019-12-02 DIAGNOSIS — Z8546 Personal history of malignant neoplasm of prostate: Secondary | ICD-10-CM | POA: Diagnosis not present

## 2019-12-02 DIAGNOSIS — M1990 Unspecified osteoarthritis, unspecified site: Secondary | ICD-10-CM | POA: Diagnosis present

## 2019-12-02 DIAGNOSIS — Z7901 Long term (current) use of anticoagulants: Secondary | ICD-10-CM | POA: Diagnosis not present

## 2019-12-02 DIAGNOSIS — N179 Acute kidney failure, unspecified: Secondary | ICD-10-CM | POA: Diagnosis not present

## 2019-12-02 DIAGNOSIS — Z9079 Acquired absence of other genital organ(s): Secondary | ICD-10-CM | POA: Diagnosis not present

## 2019-12-02 DIAGNOSIS — C61 Malignant neoplasm of prostate: Secondary | ICD-10-CM | POA: Diagnosis not present

## 2019-12-02 DIAGNOSIS — Z833 Family history of diabetes mellitus: Secondary | ICD-10-CM | POA: Diagnosis not present

## 2019-12-02 DIAGNOSIS — Z87891 Personal history of nicotine dependence: Secondary | ICD-10-CM | POA: Diagnosis not present

## 2019-12-02 DIAGNOSIS — N3289 Other specified disorders of bladder: Secondary | ICD-10-CM | POA: Diagnosis not present

## 2019-12-02 DIAGNOSIS — N029 Recurrent and persistent hematuria with unspecified morphologic changes: Secondary | ICD-10-CM | POA: Diagnosis not present

## 2019-12-02 DIAGNOSIS — N3001 Acute cystitis with hematuria: Secondary | ICD-10-CM | POA: Diagnosis not present

## 2019-12-02 DIAGNOSIS — D649 Anemia, unspecified: Secondary | ICD-10-CM | POA: Diagnosis not present

## 2019-12-02 DIAGNOSIS — F4310 Post-traumatic stress disorder, unspecified: Secondary | ICD-10-CM | POA: Diagnosis present

## 2019-12-02 DIAGNOSIS — F329 Major depressive disorder, single episode, unspecified: Secondary | ICD-10-CM | POA: Diagnosis present

## 2019-12-02 DIAGNOSIS — Z7951 Long term (current) use of inhaled steroids: Secondary | ICD-10-CM | POA: Diagnosis not present

## 2019-12-02 DIAGNOSIS — Z20822 Contact with and (suspected) exposure to covid-19: Secondary | ICD-10-CM | POA: Diagnosis present

## 2019-12-02 DIAGNOSIS — N3091 Cystitis, unspecified with hematuria: Secondary | ICD-10-CM | POA: Diagnosis present

## 2019-12-02 DIAGNOSIS — R339 Retention of urine, unspecified: Secondary | ICD-10-CM | POA: Diagnosis present

## 2019-12-02 DIAGNOSIS — N1832 Chronic kidney disease, stage 3b: Secondary | ICD-10-CM | POA: Diagnosis not present

## 2019-12-02 DIAGNOSIS — E1129 Type 2 diabetes mellitus with other diabetic kidney complication: Secondary | ICD-10-CM | POA: Insufficient documentation

## 2019-12-02 DIAGNOSIS — Z79899 Other long term (current) drug therapy: Secondary | ICD-10-CM | POA: Diagnosis not present

## 2019-12-02 DIAGNOSIS — Z880 Allergy status to penicillin: Secondary | ICD-10-CM | POA: Diagnosis not present

## 2019-12-02 DIAGNOSIS — D689 Coagulation defect, unspecified: Secondary | ICD-10-CM | POA: Diagnosis present

## 2019-12-02 DIAGNOSIS — I10 Essential (primary) hypertension: Secondary | ICD-10-CM | POA: Diagnosis not present

## 2019-12-02 DIAGNOSIS — K219 Gastro-esophageal reflux disease without esophagitis: Secondary | ICD-10-CM | POA: Diagnosis present

## 2019-12-02 DIAGNOSIS — R338 Other retention of urine: Secondary | ICD-10-CM | POA: Diagnosis not present

## 2019-12-02 DIAGNOSIS — Z882 Allergy status to sulfonamides status: Secondary | ICD-10-CM | POA: Diagnosis not present

## 2019-12-02 DIAGNOSIS — R109 Unspecified abdominal pain: Secondary | ICD-10-CM | POA: Diagnosis not present

## 2019-12-02 DIAGNOSIS — R791 Abnormal coagulation profile: Secondary | ICD-10-CM | POA: Diagnosis not present

## 2019-12-02 DIAGNOSIS — Z923 Personal history of irradiation: Secondary | ICD-10-CM | POA: Diagnosis not present

## 2019-12-02 LAB — URINALYSIS, ROUTINE W REFLEX MICROSCOPIC: RBC / HPF: >50 RBC/hpf — ABNORMAL HIGH (ref 0–5)

## 2019-12-02 NOTE — Discharge Instructions (Signed)
Return for fever nausea vomiting or pain in the side.  Also return if you are unable to urinate.  Follow-up with urologist in the office.  Take the antibiotics until you have completed the bottle.

## 2019-12-02 NOTE — ED Triage Notes (Signed)
Pt reports hematuria since 2200 with clots seen in urine.
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
## 2019-12-04 ENCOUNTER — Emergency Department (HOSPITAL_COMMUNITY): Payer: PPO

## 2019-12-04 ENCOUNTER — Encounter (HOSPITAL_COMMUNITY): Payer: Self-pay

## 2019-12-04 ENCOUNTER — Other Ambulatory Visit: Payer: Self-pay

## 2019-12-04 ENCOUNTER — Inpatient Hospital Stay (HOSPITAL_COMMUNITY)
Admission: EM | Admit: 2019-12-04 | Discharge: 2019-12-09 | DRG: 663 | Disposition: A | Discharge status: Home / Self Care | Payer: PPO | Attending: Internal Medicine | Admitting: Internal Medicine

## 2019-12-04 DIAGNOSIS — Z20822 Contact with and (suspected) exposure to covid-19: Secondary | ICD-10-CM | POA: Diagnosis present

## 2019-12-04 DIAGNOSIS — N029 Recurrent and persistent hematuria with unspecified morphologic changes: Secondary | ICD-10-CM | POA: Diagnosis not present

## 2019-12-04 DIAGNOSIS — Z87891 Personal history of nicotine dependence: Secondary | ICD-10-CM | POA: Diagnosis not present

## 2019-12-04 DIAGNOSIS — N3091 Cystitis, unspecified with hematuria: Secondary | ICD-10-CM | POA: Diagnosis present

## 2019-12-04 DIAGNOSIS — M1990 Unspecified osteoarthritis, unspecified site: Secondary | ICD-10-CM | POA: Diagnosis present

## 2019-12-04 DIAGNOSIS — K219 Gastro-esophageal reflux disease without esophagitis: Secondary | ICD-10-CM | POA: Diagnosis present

## 2019-12-04 DIAGNOSIS — D62 Acute posthemorrhagic anemia: Secondary | ICD-10-CM | POA: Diagnosis present

## 2019-12-04 DIAGNOSIS — R109 Unspecified abdominal pain: Secondary | ICD-10-CM | POA: Diagnosis not present

## 2019-12-04 DIAGNOSIS — N179 Acute kidney failure, unspecified: Secondary | ICD-10-CM

## 2019-12-04 DIAGNOSIS — Z7984 Long term (current) use of oral hypoglycemic drugs: Secondary | ICD-10-CM | POA: Diagnosis not present

## 2019-12-04 DIAGNOSIS — F329 Major depressive disorder, single episode, unspecified: Secondary | ICD-10-CM | POA: Diagnosis present

## 2019-12-04 DIAGNOSIS — R791 Abnormal coagulation profile: Secondary | ICD-10-CM | POA: Diagnosis not present

## 2019-12-04 DIAGNOSIS — Z79899 Other long term (current) drug therapy: Secondary | ICD-10-CM | POA: Diagnosis not present

## 2019-12-04 DIAGNOSIS — E119 Type 2 diabetes mellitus without complications: Secondary | ICD-10-CM | POA: Diagnosis present

## 2019-12-04 DIAGNOSIS — Z9079 Acquired absence of other genital organ(s): Secondary | ICD-10-CM

## 2019-12-04 DIAGNOSIS — Z86718 Personal history of other venous thrombosis and embolism: Secondary | ICD-10-CM

## 2019-12-04 DIAGNOSIS — D72829 Elevated white blood cell count, unspecified: Secondary | ICD-10-CM | POA: Diagnosis not present

## 2019-12-04 DIAGNOSIS — F4310 Post-traumatic stress disorder, unspecified: Secondary | ICD-10-CM | POA: Diagnosis present

## 2019-12-04 DIAGNOSIS — E1129 Type 2 diabetes mellitus with other diabetic kidney complication: Secondary | ICD-10-CM | POA: Diagnosis not present

## 2019-12-04 DIAGNOSIS — Z923 Personal history of irradiation: Secondary | ICD-10-CM | POA: Diagnosis not present

## 2019-12-04 DIAGNOSIS — R310 Gross hematuria: Secondary | ICD-10-CM | POA: Diagnosis present

## 2019-12-04 DIAGNOSIS — Z7901 Long term (current) use of anticoagulants: Secondary | ICD-10-CM

## 2019-12-04 DIAGNOSIS — I10 Essential (primary) hypertension: Secondary | ICD-10-CM | POA: Diagnosis present

## 2019-12-04 DIAGNOSIS — Z7951 Long term (current) use of inhaled steroids: Secondary | ICD-10-CM

## 2019-12-04 DIAGNOSIS — Z880 Allergy status to penicillin: Secondary | ICD-10-CM | POA: Diagnosis not present

## 2019-12-04 DIAGNOSIS — R319 Hematuria, unspecified: Secondary | ICD-10-CM | POA: Diagnosis present

## 2019-12-04 DIAGNOSIS — R339 Retention of urine, unspecified: Secondary | ICD-10-CM | POA: Diagnosis present

## 2019-12-04 DIAGNOSIS — Z833 Family history of diabetes mellitus: Secondary | ICD-10-CM | POA: Diagnosis not present

## 2019-12-04 DIAGNOSIS — D649 Anemia, unspecified: Secondary | ICD-10-CM | POA: Diagnosis not present

## 2019-12-04 DIAGNOSIS — I9581 Postprocedural hypotension: Secondary | ICD-10-CM | POA: Diagnosis not present

## 2019-12-04 DIAGNOSIS — C61 Malignant neoplasm of prostate: Secondary | ICD-10-CM | POA: Diagnosis not present

## 2019-12-04 DIAGNOSIS — Z882 Allergy status to sulfonamides status: Secondary | ICD-10-CM | POA: Diagnosis not present

## 2019-12-04 DIAGNOSIS — D689 Coagulation defect, unspecified: Secondary | ICD-10-CM | POA: Diagnosis present

## 2019-12-04 DIAGNOSIS — Z8546 Personal history of malignant neoplasm of prostate: Secondary | ICD-10-CM | POA: Diagnosis not present

## 2019-12-04 DIAGNOSIS — N1832 Chronic kidney disease, stage 3b: Secondary | ICD-10-CM

## 2019-12-04 DIAGNOSIS — N3289 Other specified disorders of bladder: Secondary | ICD-10-CM | POA: Diagnosis present

## 2019-12-04 DIAGNOSIS — R338 Other retention of urine: Secondary | ICD-10-CM | POA: Diagnosis not present

## 2019-12-04 DIAGNOSIS — N3001 Acute cystitis with hematuria: Secondary | ICD-10-CM | POA: Diagnosis not present

## 2019-12-04 LAB — BASIC METABOLIC PANEL
Anion gap: 11 (ref 5–15)
BUN: 21 mg/dL (ref 8–23)
CO2: 23 mmol/L (ref 22–32)
Calcium: 8.4 mg/dL — ABNORMAL LOW (ref 8.9–10.3)
Chloride: 99 mmol/L (ref 98–111)
Creatinine, Ser: 1.73 mg/dL — ABNORMAL HIGH (ref 0.61–1.24)
GFR calc Af Amer: 45 mL/min — ABNORMAL LOW (ref >60)
GFR calc non Af Amer: 39 mL/min — ABNORMAL LOW (ref >60)
Glucose, Bld: 148 mg/dL — ABNORMAL HIGH (ref 70–99)
Potassium: 4.4 mmol/L (ref 3.5–5.1)
Sodium: 133 mmol/L — ABNORMAL LOW (ref 135–145)

## 2019-12-04 LAB — CBC WITH DIFFERENTIAL/PLATELET
Abs Immature Granulocytes: 0.1 10*3/uL — ABNORMAL HIGH (ref 0.00–0.07)
Basophils Absolute: 0 10*3/uL (ref 0.0–0.1)
Basophils Relative: 0 %
Eosinophils Absolute: 0 10*3/uL (ref 0.0–0.5)
Eosinophils Relative: 0 %
HCT: 31.8 % — ABNORMAL LOW (ref 39.0–52.0)
Hemoglobin: 10.5 g/dL — ABNORMAL LOW (ref 13.0–17.0)
Immature Granulocytes: 1 %
Lymphocytes Relative: 8 %
Lymphs Abs: 0.9 10*3/uL (ref 0.7–4.0)
MCH: 30.5 pg (ref 26.0–34.0)
MCHC: 33 g/dL (ref 30.0–36.0)
MCV: 92.4 fL (ref 80.0–100.0)
Monocytes Absolute: 0.4 10*3/uL (ref 0.1–1.0)
Monocytes Relative: 4 %
Neutro Abs: 9.8 10*3/uL — ABNORMAL HIGH (ref 1.7–7.7)
Neutrophils Relative %: 87 %
Platelets: 268 10*3/uL (ref 150–400)
RBC: 3.44 MIL/uL — ABNORMAL LOW (ref 4.22–5.81)
RDW: 14.8 % (ref 11.5–15.5)
WBC: 11.3 10*3/uL — ABNORMAL HIGH (ref 4.0–10.5)
nRBC: 0 % (ref 0.0–0.2)

## 2019-12-04 LAB — URINALYSIS, ROUTINE W REFLEX MICROSCOPIC: RBC / HPF: >50 RBC/hpf — ABNORMAL HIGH (ref 0–5)

## 2019-12-04 LAB — HEMOGLOBIN AND HEMATOCRIT, BLOOD
HCT: 30.7 % — ABNORMAL LOW (ref 39.0–52.0)
HCT: 29.7 % — ABNORMAL LOW (ref 39.0–52.0)
Hemoglobin: 10.1 g/dL — ABNORMAL LOW (ref 13.0–17.0)
Hemoglobin: 9.6 g/dL — ABNORMAL LOW (ref 13.0–17.0)

## 2019-12-04 LAB — GLUCOSE, CAPILLARY
Glucose-Capillary: 128 mg/dL — ABNORMAL HIGH (ref 70–99)
Glucose-Capillary: 90 mg/dL (ref 70–99)

## 2019-12-04 LAB — PROTIME-INR
INR: 3.8 — ABNORMAL HIGH (ref 0.8–1.2)
Prothrombin Time: 36.6 seconds — ABNORMAL HIGH (ref 11.4–15.2)

## 2019-12-04 LAB — RESPIRATORY PANEL BY RT PCR (FLU A&B, COVID)
Influenza A by PCR: NEGATIVE
Influenza B by PCR: NEGATIVE
SARS Coronavirus 2 by RT PCR: NEGATIVE

## 2019-12-04 LAB — HEMOGLOBIN A1C
Hgb A1c MFr Bld: 6.2 % — ABNORMAL HIGH (ref 4.8–5.6)
Mean Plasma Glucose: 131.24 mg/dL

## 2019-12-04 MED ORDER — MOMETASONE FURO-FORMOTEROL FUM 200-5 MCG/ACT IN AERO
2.0000 | INHALATION_SPRAY | Freq: Two times a day (BID) | RESPIRATORY_TRACT | Status: DC
  Administered 2019-12-04 – 2019-12-09 (×10): 2 via RESPIRATORY_TRACT
  Filled 2019-12-04: qty 8.8

## 2019-12-04 MED ORDER — CEFDINIR 300 MG PO CAPS: 300.0000 mg | ORAL_CAPSULE | Freq: Two times a day (BID) | ORAL | Status: DC

## 2019-12-04 MED ORDER — ZOLPIDEM TARTRATE 5 MG PO TABS
5.0000 mg | ORAL_TABLET | Freq: Every evening | ORAL | Status: DC | PRN
  Administered 2019-12-04 – 2019-12-08 (×5): 5 mg via ORAL
  Filled 2019-12-04 (×5): qty 1

## 2019-12-04 MED ORDER — SODIUM CHLORIDE 0.9 % IV SOLN: Freq: Once | INTRAVENOUS | Status: AC

## 2019-12-04 MED ORDER — CHLORHEXIDINE GLUCONATE CLOTH 2 % EX PADS
6.0000 | MEDICATED_PAD | Freq: Every day | CUTANEOUS | Status: DC
  Administered 2019-12-05 – 2019-12-08 (×4): 6 via TOPICAL

## 2019-12-04 MED ORDER — CEFPODOXIME PROXETIL 200 MG PO TABS
200.0000 mg | ORAL_TABLET | Freq: Two times a day (BID) | ORAL | Status: AC
  Administered 2019-12-04 – 2019-12-05 (×3): 200 mg via ORAL
  Filled 2019-12-04 (×3): qty 1

## 2019-12-04 MED ORDER — IOHEXOL 300 MG/ML  SOLN
80.0000 mL | Freq: Once | INTRAMUSCULAR | Status: AC | PRN
  Administered 2019-12-04: 80 mL via INTRAVENOUS

## 2019-12-04 MED ORDER — SODIUM CHLORIDE 0.9 % IV SOLN: INTRAVENOUS | Status: DC

## 2019-12-04 MED ORDER — HYDROCODONE-ACETAMINOPHEN 5-325 MG PO TABS: 1.0000 | ORAL_TABLET | ORAL | Status: DC | PRN

## 2019-12-04 MED ORDER — ONDANSETRON HCL 4 MG/2ML IJ SOLN: 4.0000 mg | Freq: Four times a day (QID) | INTRAMUSCULAR | Status: DC | PRN

## 2019-12-04 MED ORDER — SERTRALINE HCL 100 MG PO TABS
100.0000 mg | ORAL_TABLET | Freq: Every day | ORAL | Status: DC
  Administered 2019-12-04 – 2019-12-08 (×5): 100 mg via ORAL
  Filled 2019-12-04 (×6): qty 1

## 2019-12-04 MED ORDER — LOSARTAN POTASSIUM 50 MG PO TABS
50.0000 mg | ORAL_TABLET | Freq: Every day | ORAL | Status: DC
  Administered 2019-12-04 – 2019-12-06 (×3): 50 mg via ORAL
  Filled 2019-12-04 (×3): qty 1

## 2019-12-04 MED ORDER — LORATADINE 10 MG PO TABS
10.0000 mg | ORAL_TABLET | Freq: Every day | ORAL | Status: DC
  Administered 2019-12-04 – 2019-12-08 (×5): 10 mg via ORAL
  Filled 2019-12-04 (×5): qty 1

## 2019-12-04 MED ORDER — ACETAMINOPHEN 325 MG PO TABS: 650.0000 mg | ORAL_TABLET | Freq: Four times a day (QID) | ORAL | Status: DC | PRN

## 2019-12-04 MED ORDER — INSULIN ASPART 100 UNIT/ML ~~LOC~~ SOLN
0.0000 [IU] | Freq: Three times a day (TID) | SUBCUTANEOUS | Status: DC
  Filled 2019-12-04: qty 0.09

## 2019-12-04 MED ORDER — ONDANSETRON HCL 4 MG PO TABS: 4.0000 mg | ORAL_TABLET | Freq: Four times a day (QID) | ORAL | Status: DC | PRN

## 2019-12-04 MED ORDER — MORPHINE SULFATE (PF) 4 MG/ML IV SOLN
4.0000 mg | Freq: Once | INTRAVENOUS | Status: AC
  Administered 2019-12-04: 4 mg via INTRAVENOUS
  Filled 2019-12-04: qty 1

## 2019-12-04 MED ORDER — PANTOPRAZOLE SODIUM 40 MG PO TBEC
40.0000 mg | DELAYED_RELEASE_TABLET | Freq: Every day | ORAL | Status: DC
  Administered 2019-12-04 – 2019-12-08 (×5): 40 mg via ORAL
  Filled 2019-12-04 (×5): qty 1

## 2019-12-04 MED ORDER — ACETAMINOPHEN 650 MG RE SUPP: 650.0000 mg | Freq: Four times a day (QID) | RECTAL | Status: DC | PRN

## 2019-12-04 MED ORDER — GABAPENTIN 300 MG PO CAPS
300.0000 mg | ORAL_CAPSULE | Freq: Three times a day (TID) | ORAL | Status: DC
  Administered 2019-12-04 – 2019-12-08 (×13): 300 mg via ORAL
  Filled 2019-12-04 (×13): qty 1

## 2019-12-04 NOTE — ED Notes (Signed)
Bladder scan showed 172 mL. Bladder painful to palpation. CBI clamped D/T no drainage into the catheter. Bloody urine noted around catheter, soaked through 3 pads.
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
## 2019-12-04 NOTE — ED Triage Notes (Signed)
Pt presents to ER with hematuria and clots coming from penis. Similar encounter 2 days ago.
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
## 2019-12-04 NOTE — ED Notes (Signed)
Irrigation to bladder successful, put in about 100 cc of normal saline, got about 300 cc of bloody urine with many small clots. CBI restarted, urine draining freely at this time.

## 2019-12-05 ENCOUNTER — Inpatient Hospital Stay (HOSPITAL_COMMUNITY): Payer: PPO | Admitting: Anesthesiology

## 2019-12-05 ENCOUNTER — Encounter (HOSPITAL_COMMUNITY): Payer: Self-pay | Admitting: Internal Medicine

## 2019-12-05 ENCOUNTER — Encounter (HOSPITAL_COMMUNITY)
Admission: EM | Disposition: A | Discharge status: Home / Self Care | Payer: Self-pay | Source: Home / Self Care | Attending: Internal Medicine

## 2019-12-05 HISTORY — PX: CYSTOSCOPY WITH FULGERATION: SHX6638

## 2019-12-05 LAB — COMPREHENSIVE METABOLIC PANEL
ALT: 13 U/L (ref 0–44)
AST: 16 U/L (ref 15–41)
Albumin: 3 g/dL — ABNORMAL LOW (ref 3.5–5.0)
Alkaline Phosphatase: 29 U/L — ABNORMAL LOW (ref 38–126)
Anion gap: 6 (ref 5–15)
BUN: 14 mg/dL (ref 8–23)
CO2: 25 mmol/L (ref 22–32)
Calcium: 8 mg/dL — ABNORMAL LOW (ref 8.9–10.3)
Chloride: 109 mmol/L (ref 98–111)
Creatinine, Ser: 1.27 mg/dL — ABNORMAL HIGH (ref 0.61–1.24)
GFR calc Af Amer: >60 mL/min (ref >60)
GFR calc non Af Amer: 56 mL/min — ABNORMAL LOW (ref >60)
Glucose, Bld: 100 mg/dL — ABNORMAL HIGH (ref 70–99)
Potassium: 3.9 mmol/L (ref 3.5–5.1)
Sodium: 140 mmol/L (ref 135–145)
Total Bilirubin: 0.6 mg/dL (ref 0.3–1.2)
Total Protein: 5.3 g/dL — ABNORMAL LOW (ref 6.5–8.1)

## 2019-12-05 LAB — CBC
HCT: 25 % — ABNORMAL LOW (ref 39.0–52.0)
Hemoglobin: 8.3 g/dL — ABNORMAL LOW (ref 13.0–17.0)
MCH: 31.6 pg (ref 26.0–34.0)
MCHC: 33.2 g/dL (ref 30.0–36.0)
MCV: 95.1 fL (ref 80.0–100.0)
Platelets: 180 10*3/uL (ref 150–400)
RBC: 2.63 MIL/uL — ABNORMAL LOW (ref 4.22–5.81)
RDW: 14.7 % (ref 11.5–15.5)
WBC: 8.2 10*3/uL (ref 4.0–10.5)
nRBC: 0 % (ref 0.0–0.2)

## 2019-12-05 LAB — PROTIME-INR
INR: 3.9 — ABNORMAL HIGH (ref 0.8–1.2)
INR: 1.8 — ABNORMAL HIGH (ref 0.8–1.2)
Prothrombin Time: 36.9 seconds — ABNORMAL HIGH (ref 11.4–15.2)
Prothrombin Time: 20.3 seconds — ABNORMAL HIGH (ref 11.4–15.2)

## 2019-12-05 LAB — IRON AND TIBC
Iron: 63 ug/dL (ref 45–182)
Saturation Ratios: 28 % (ref 17.9–39.5)
TIBC: 228 ug/dL — ABNORMAL LOW (ref 250–450)
UIBC: 165 ug/dL

## 2019-12-05 LAB — GLUCOSE, CAPILLARY
Glucose-Capillary: 92 mg/dL (ref 70–99)
Glucose-Capillary: 87 mg/dL (ref 70–99)
Glucose-Capillary: 80 mg/dL (ref 70–99)
Glucose-Capillary: 159 mg/dL — ABNORMAL HIGH (ref 70–99)

## 2019-12-05 LAB — HEMOGLOBIN AND HEMATOCRIT, BLOOD
HCT: 25.3 % — ABNORMAL LOW (ref 39.0–52.0)
Hemoglobin: 8.1 g/dL — ABNORMAL LOW (ref 13.0–17.0)

## 2019-12-05 SURGERY — CYSTOSCOPY, WITH BLADDER FULGURATION
Anesthesia: General

## 2019-12-05 MED ORDER — FENTANYL CITRATE (PF) 100 MCG/2ML IJ SOLN
INTRAMUSCULAR | Status: AC
  Filled 2019-12-05: qty 2

## 2019-12-05 MED ORDER — LIDOCAINE HCL URETHRAL/MUCOSAL 2 % EX GEL
CUTANEOUS | Status: DC | PRN
  Administered 2019-12-05: 1

## 2019-12-05 MED ORDER — LIDOCAINE HCL URETHRAL/MUCOSAL 2 % EX GEL
CUTANEOUS | Status: AC
  Filled 2019-12-05: qty 30

## 2019-12-05 MED ORDER — ACETAMINOPHEN 10 MG/ML IV SOLN: 1000.0000 mg | Freq: Once | INTRAVENOUS | Status: DC | PRN

## 2019-12-05 MED ORDER — PROPOFOL 10 MG/ML IV BOLUS
INTRAVENOUS | Status: AC
  Filled 2019-12-05: qty 20

## 2019-12-05 MED ORDER — BELLADONNA ALKALOIDS-OPIUM 16.2-30 MG RE SUPP
RECTAL | Status: AC
  Filled 2019-12-05: qty 1

## 2019-12-05 MED ORDER — LACTATED RINGERS IV SOLN: INTRAVENOUS | Status: DC

## 2019-12-05 MED ORDER — CHLORHEXIDINE GLUCONATE 0.12 % MT SOLN
15.0000 mL | OROMUCOSAL | Status: AC
  Administered 2019-12-05: 15 mL via OROMUCOSAL

## 2019-12-05 MED ORDER — DEXAMETHASONE SODIUM PHOSPHATE 10 MG/ML IJ SOLN
INTRAMUSCULAR | Status: AC
  Filled 2019-12-05: qty 1

## 2019-12-05 MED ORDER — FENTANYL CITRATE (PF) 100 MCG/2ML IJ SOLN
INTRAMUSCULAR | Status: DC | PRN
  Administered 2019-12-05 (×2): 25 ug via INTRAVENOUS

## 2019-12-05 MED ORDER — EPHEDRINE SULFATE-NACL 50-0.9 MG/10ML-% IV SOSY
PREFILLED_SYRINGE | INTRAVENOUS | Status: DC | PRN
  Administered 2019-12-05 (×3): 10 mg via INTRAVENOUS

## 2019-12-05 MED ORDER — PHENYLEPHRINE 40 MCG/ML (10ML) SYRINGE FOR IV PUSH (FOR BLOOD PRESSURE SUPPORT)
PREFILLED_SYRINGE | INTRAVENOUS | Status: AC
  Filled 2019-12-05: qty 20

## 2019-12-05 MED ORDER — PROPOFOL 10 MG/ML IV BOLUS
INTRAVENOUS | Status: DC | PRN
  Administered 2019-12-05: 200 mg via INTRAVENOUS

## 2019-12-05 MED ORDER — ONDANSETRON HCL 4 MG/2ML IJ SOLN
INTRAMUSCULAR | Status: AC
  Filled 2019-12-05: qty 2

## 2019-12-05 MED ORDER — ONDANSETRON HCL 4 MG/2ML IJ SOLN
INTRAMUSCULAR | Status: DC | PRN
  Administered 2019-12-05: 4 mg via INTRAVENOUS

## 2019-12-05 MED ORDER — FENTANYL CITRATE (PF) 100 MCG/2ML IJ SOLN: 25.0000 ug | INTRAMUSCULAR | Status: DC | PRN

## 2019-12-05 MED ORDER — LIDOCAINE 2% (20 MG/ML) 5 ML SYRINGE
INTRAMUSCULAR | Status: AC
  Filled 2019-12-05: qty 5

## 2019-12-05 MED ORDER — BELLADONNA ALKALOIDS-OPIUM 16.2-60 MG RE SUPP
RECTAL | Status: DC | PRN
Start: 2019-12-05 — End: 2019-12-05
  Administered 2019-12-05: 1 via RECTAL

## 2019-12-05 MED ORDER — PHENYLEPHRINE 40 MCG/ML (10ML) SYRINGE FOR IV PUSH (FOR BLOOD PRESSURE SUPPORT)
PREFILLED_SYRINGE | INTRAVENOUS | Status: DC | PRN
  Administered 2019-12-05: 80 ug via INTRAVENOUS
  Administered 2019-12-05: 120 ug via INTRAVENOUS
  Administered 2019-12-05: 80 ug via INTRAVENOUS
  Administered 2019-12-05: 120 ug via INTRAVENOUS

## 2019-12-05 MED ORDER — SODIUM CHLORIDE 0.9% IV SOLUTION: Freq: Once | INTRAVENOUS | Status: AC

## 2019-12-05 MED ORDER — DEXAMETHASONE SODIUM PHOSPHATE 10 MG/ML IJ SOLN
INTRAMUSCULAR | Status: DC | PRN
  Administered 2019-12-05: 5 mg via INTRAVENOUS

## 2019-12-05 MED ORDER — LIDOCAINE 2% (20 MG/ML) 5 ML SYRINGE
INTRAMUSCULAR | Status: DC | PRN
  Administered 2019-12-05: 60 mg via INTRAVENOUS

## 2019-12-05 MED ORDER — STERILE WATER FOR IRRIGATION IR SOLN
Status: DC | PRN
  Administered 2019-12-05: 3000 mL

## 2019-12-05 MED ORDER — EPHEDRINE 5 MG/ML INJ
INTRAVENOUS | Status: AC
  Filled 2019-12-05: qty 10

## 2019-12-05 SURGICAL SUPPLY — 15 items
BAG DRN RND TRDRP ANRFLXCHMBR (UROLOGICAL SUPPLIES)
BAG URINE DRAIN 2000ML AR STRL (UROLOGICAL SUPPLIES) IMPLANT
BAG URO CATCHER STRL LF (MISCELLANEOUS) ×3 IMPLANT
GLOVE BIOGEL M STRL SZ7.5 (GLOVE) ×3 IMPLANT
GOWN STRL REUS W/TWL XL LVL3 (GOWN DISPOSABLE) ×3 IMPLANT
KIT TURNOVER KIT A (KITS) ×2 IMPLANT
LOOP CUT BIPOLAR 24F LRG (ELECTROSURGICAL) IMPLANT
MANIFOLD NEPTUNE II (INSTRUMENTS) ×3 IMPLANT
PACK CYSTO (CUSTOM PROCEDURE TRAY) ×3 IMPLANT
PENCIL SMOKE EVACUATOR (MISCELLANEOUS) IMPLANT
SYR TOOMEY IRRIG 70ML (MISCELLANEOUS) ×3
SYRINGE TOOMEY IRRIG 70ML (MISCELLANEOUS) IMPLANT
TUBING CONNECTING 10 (TUBING) ×2 IMPLANT
TUBING CONNECTING 10' (TUBING) ×1
TUBING UROLOGY SET (TUBING) ×1 IMPLANT

## 2019-12-05 NOTE — Progress Notes (Signed)
PROGRESS NOTE    Glenn Logan  PNT:[id number] DOB: Mar 28, 1945 DOA: 12/04/2019 PCP: [place]    Chief Complaint  Patient presents with  . Hematuria    Brief Narrative:  74 year old gentleman prior history of prostate cancer, diabetes mellitus, history of DVT on Coumadin hypertension presents to ED for hematuria. On arrival to ED he was found to have supratherapeutic INR.  Coumadin was held, but patient continued to have active bleeding with clots and INR remained elevated at 3.9. Urology consulted recommended cystoscopy with CBI and required INR to be less than 2. 2 units of FFP were ordered after discussing with the patient, for reversal of Coumadin for cystoscopy later today. Patient seen and examined this morning no new complaints other than hematuria. Assessment & Plan:   Active Problems:   Hematuria   Gross hematuria possibly secondary to hemorrhagic cystitis Not improving with CBI trial.  Probably will need cystoscopy later on. Appreciate Dr. Junious Silk assistance Monitor him and transfuse as needed to keep greater than 7     Essential hypertension Well-controlled   Type 2 diabetes mellitus CBG (last 3)  Recent Labs    12/04/19 2055 12/05/19 0753 12/05/19 1135  GLUCAP 90 92 87   Continue with sliding scale insulin.   GERD  Stable.   H/o DVT on coumadin.  Supra therapeutic INR on admission.  2 FFP's ordered and repeat INR after infusion.    DVT prophylaxis: SCDs Code Status: (Full code Family Communication: none at bedside.  Disposition:   Status is: Inpatient  Remains inpatient appropriate because:Ongoing diagnostic testing needed not appropriate for outpatient work up, IV treatments appropriate due to intensity of illness or inability to take PO and Inpatient level of care appropriate due to severity of illness   Dispo: The patient is from: Home              Anticipated d/c is to: pending.               Anticipated d/c date is:  2 days              Patient currently is not medically stable to d/c.       Consultants:   Urology.   Procedures: cystoscopy Antimicrobials:  Antibiotics Given (last 72 hours)    Date/Time Action Medication Dose   12/04/19 2131 Given   [MAR Hold] cefpodoxime (VANTIN) tablet 200 mg 200 mg   12/05/19 1201 Given   [MAR Hold] cefpodoxime (VANTIN) tablet 200 mg 200 mg      Subjective: Some abd cramps with blood clots.   Objective: Vitals:   12/04/19 2050 12/05/19 0430 12/05/19 1117 12/05/19 1156  BP: 115/64 128/76 102/61 107/64  Pulse: (!) 55 (!) 52 72 75  Resp: 18 18 18 18   Temp: 98.2 F (36.8 C) 98.2 F (36.8 C) 98.2 F (36.8 C) 98.2 F (36.8 C)  TempSrc: Oral Oral Oral Oral  SpO2: 100% 100% 100% 100%  Weight:      Height:        Intake/Output Summary (Last 24 hours) at 12/05/2019 1252 Last data filed at 12/05/2019 1200 Gross per 24 hour  Intake 36552.06 ml  Output 48850 ml  Net -12297.94 ml   Filed Weights   12/04/19 0634  Weight: 62.1 kg    Examination:  General exam: Appears calm and comfortable  Respiratory system: Clear to auscultation. Respiratory effort normal. Cardiovascular system: S1 & S2 heard, RRR. No JVD, . No pedal edema. Gastrointestinal system: Abdomen  is nondistended, soft and nontender. Normal bowel sounds heard. Central nervous system: Alert and oriented. No focal neurological deficits. Extremities: Symmetric 5 x 5 power. Skin: No rashes, lesions or ulcers Psychiatry:  Mood & affect appropriate.     Data Reviewed: I have personally reviewed following labs and imaging studies  CBC: Recent Labs  Lab 12/04/19 0743 12/04/19 1441 12/04/19 2030 12/05/19 0235 12/05/19 0814  WBC 11.3*  --   --  8.2  --   NEUTROABS 9.8*  --   --   --   --   HGB 10.5* 10.1* 9.6* 8.3* 8.1*  HCT 31.8* 30.7* 29.7* 25.0* 25.3*  MCV 92.4  --   --  95.1  --   PLT 268  --   --  180  --     Basic Metabolic Panel: Recent Labs  Lab 12/04/19 0743  12/05/19 0235  NA 133* 140  K 4.4 3.9  CL 99 109  CO2 23 25  GLUCOSE 148* 100*  BUN 21 14  CREATININE 1.73* 1.27*  CALCIUM 8.4* 8.0*    GFR: Estimated Creatinine Clearance: 46.2 mL/min (A) (by C-G formula based on SCr of 1.27 mg/dL (H)).  Liver Function Tests: Recent Labs  Lab 12/05/19 0235  AST 16  ALT 13  ALKPHOS 29*  BILITOT 0.6  PROT 5.3*  ALBUMIN 3.0*    CBG: Recent Labs  Lab 12/04/19 1620 12/04/19 2055 12/05/19 0753 12/05/19 1135  GLUCAP 128* 90 92 87     Recent Results (from the past 240 hour(s))  Respiratory Panel by RT PCR (Flu A&B, Covid) - Nasopharyngeal Swab     Status: None   Collection Time: 12/04/19 11:11 AM   Specimen: Nasopharyngeal Swab  Result Value Ref Range Status   SARS Coronavirus 2 by RT PCR NEGATIVE NEGATIVE Final    Comment: (NOTE) SARS-CoV-2 target nucleic acids are NOT DETECTED.  The SARS-CoV-2 RNA is generally detectable in upper respiratoy specimens during the acute phase of infection. The lowest concentration of SARS-CoV-2 viral copies this assay can detect is 131 copies/mL. A negative result does not preclude SARS-Cov-2 infection and should not be used as the sole basis for treatment or other patient management decisions. A negative result may occur with  improper specimen collection/handling, submission of specimen other than nasopharyngeal swab, presence of viral mutation(s) within the areas targeted by this assay, and inadequate number of viral copies (<131 copies/mL). A negative result must be combined with clinical observations, patient history, and epidemiological information. The expected result is Negative.  Fact Sheet for Patients:  PinkCheek.be  Fact Sheet for Healthcare Providers:  GravelBags.it  This test is no t yet approved or cleared by the [place] FDA and  has been authorized for detection and/or diagnosis of SARS-CoV-2 by FDA under an  Emergency Use Authorization (EUA). This EUA will remain  in effect (meaning this test can be used) for the duration of the COVID-19 declaration under Section 564(b)(1) of the Act, 21 U.S.C. section 360bbb-3(b)(1), unless the authorization is terminated or revoked sooner.     Influenza A by PCR NEGATIVE NEGATIVE Final   Influenza B by PCR NEGATIVE NEGATIVE Final    Comment: (NOTE) The Xpert Xpress SARS-CoV-2/FLU/RSV assay is intended as an aid in  the diagnosis of influenza from Nasopharyngeal swab specimens and  should not be used as a sole basis for treatment. Nasal washings and  aspirates are unacceptable for Xpert Xpress SARS-CoV-2/FLU/RSV  testing.  Fact Sheet for Patients: PinkCheek.be  Fact Sheet  for Healthcare Providers: GravelBags.it  This test is not yet approved or cleared by the [place] and  has been authorized for detection and/or diagnosis of SARS-CoV-2 by  FDA under an Emergency Use Authorization (EUA). This EUA will remain  in effect (meaning this test can be used) for the duration of the  Covid-19 declaration under Section 564(b)(1) of the Act, 21  U.S.C. section 360bbb-3(b)(1), unless the authorization is  terminated or revoked. Performed at [facility], [place] [address], [place], [place] [address]          Radiology Studies: CT ABDOMEN PELVIS W CONTRAST  Result Date: 12/04/2019 CLINICAL DATA:  Suprapubic abdominal pain.  Gross hematuria EXAM: CT ABDOMEN AND PELVIS WITH CONTRAST TECHNIQUE: Multidetector CT imaging of the abdomen and pelvis was performed using the standard protocol following bolus administration of intravenous contrast. CONTRAST:  55mL OMNIPAQUE IOHEXOL 300 MG/ML  SOLN COMPARISON:  01/06/2015 FINDINGS: Lower chest: Lung bases are clear. No effusions. Heart is normal size. Hepatobiliary: Scattered hypodensities in the liver are stable since prior study, likely  cysts. Gallbladder unremarkable. Pancreas: No focal abnormality or ductal dilatation. Spleen: No focal abnormality.  Normal size. Adrenals/Urinary Tract: Small cyst in the upper pole of the right kidney, stable. No stones or hydronephrosis. Adrenal glands are unremarkable. Foley catheter is present within the bladder. Gas and soft tissue density material noted within the bladder, likely blood clot. Stomach/Bowel: Sigmoid diverticulosis. No active diverticulitis. Normal appendix. Stomach and small bowel decompressed, unremarkable. Vascular/Lymphatic: No evidence of aneurysm or adenopathy. Reproductive: No visible focal abnormality.  Prior prostatectomy. Other: No free fluid or free air. Musculoskeletal: No acute bony abnormality. IMPRESSION: No renal or ureteral stones.  No hydronephrosis. Soft tissue density within the bladder likely reflects blood clots. Foley catheter in place. Prior prostatectomy. Sigmoid diverticulosis.  No active diverticulitis. Electronically Signed   By: Rolm Baptise [name]   On: 12/04/2019 10:51        Scheduled Meds: . [MAR Hold] cefpodoxime  200 mg Oral Q12H  . chlorhexidine  15 mL Mouth/Throat NOW  . [MAR Hold] Chlorhexidine Gluconate Cloth  6 each Topical Daily  . [MAR Hold] gabapentin  300 mg Oral TID  . [MAR Hold] insulin aspart  0-9 Units Subcutaneous TID WC  . [MAR Hold] loratadine  10 mg Oral Daily  . [MAR Hold] losartan  50 mg Oral Daily  . [MAR Hold] mometasone-formoterol  2 puff Inhalation BID  . [MAR Hold] pantoprazole  40 mg Oral Daily  . [MAR Hold] sertraline  100 mg Oral Daily   Continuous Infusions: . sodium chloride 125 mL/hr at 12/04/19 2338  . lactated ringers       LOS: 1 day        Hosie Poisson, MD [facility]   To contact the attending provider between 7A-7P or the covering provider during after hours 7P-7A, please log into the web site [email or web address] and access using universal [place] password for that web site. If you do not  have the password, please call the hospital operator.  12/05/2019, 12:52 PM

## 2019-12-05 NOTE — Progress Notes (Signed)
CBI drainage still bloody with clots. Hbg noted to be trending down, 10.1 to 8.3 this shift. On call X blount paged to notify. No new orders at this time.

## 2019-12-05 NOTE — Anesthesia Postprocedure Evaluation (Signed)
Anesthesia Post Note  Patient: Glenn Logan  Procedure(s) Performed: CYSTOSCOPY WITH FULGERATION (N/A )     Patient location during evaluation: PACU Anesthesia Type: General Level of consciousness: awake and alert Pain management: pain level controlled Vital Signs Assessment: post-procedure vital signs reviewed and stable Respiratory status: spontaneous breathing, nonlabored ventilation, respiratory function stable and patient connected to nasal cannula oxygen Cardiovascular status: blood pressure returned to baseline and stable Postop Assessment: no apparent nausea or vomiting Anesthetic complications: no   No complications documented.  Last Vitals:  Vitals:   12/05/19 1545 12/05/19 1625  BP: 105/70 118/71  Pulse: 85 73  Resp: (!) 23 16  Temp: 36.7 C 36.7 C  SpO2: 100% 100%    Last Pain:  Vitals:   12/05/19 1625  TempSrc: Oral  PainSc:                  [name] S

## 2019-12-05 NOTE — Anesthesia Procedure Notes (Signed)
Procedure Name: LMA Insertion Date/Time: 12/05/2019 2:18 PM Performed by: Lollie Sails, CRNA Pre-anesthesia Checklist: Patient identified, Emergency Drugs available, Suction available, Patient being monitored and Timeout performed Patient Re-evaluated:Patient Re-evaluated prior to induction Oxygen Delivery Method: Circle system utilized Preoxygenation: Pre-oxygenation with 100% oxygen Induction Type: IV induction Ventilation: Mask ventilation without difficulty LMA: LMA inserted LMA Size: 4.0 Number of attempts: 1 Placement Confirmation: positive ETCO2 and breath sounds checked- equal and bilateral Tube secured with: Tape Dental Injury: Teeth and Oropharynx as per pre-operative assessment

## 2019-12-05 NOTE — Anesthesia Preprocedure Evaluation (Signed)
Anesthesia Evaluation  Patient identified by MRN, date of birth, ID band Patient awake    Reviewed: Allergy & Precautions, NPO status , Patient's Chart, lab work & pertinent test results  Airway Mallampati: II  TM Distance: >3 FB Neck ROM: Full    Dental no notable dental hx.    Pulmonary neg pulmonary ROS, former smoker,    Pulmonary exam normal breath sounds clear to auscultation       Cardiovascular hypertension, + DVT  Normal cardiovascular exam Rhythm:Regular Rate:Normal     Neuro/Psych negative neurological ROS  negative psych ROS   GI/Hepatic negative GI ROS, Neg liver ROS,   Endo/Other  negative endocrine ROSdiabetes  Renal/GU negative Renal ROS  negative genitourinary   Musculoskeletal negative musculoskeletal ROS (+)   Abdominal   Peds negative pediatric ROS (+)  Hematology  (+) Blood dyscrasia, anemia , Anticoagulated  INR 3.9   Anesthesia Other Findings   Reproductive/Obstetrics negative OB ROS                             Anesthesia Physical Anesthesia Plan  ASA: III  Anesthesia Plan: General   Post-op Pain Management:    Induction: Intravenous  PONV Risk Score and Plan: 2 and Ondansetron and Dexamethasone  Airway Management Planned:   Additional Equipment:   Intra-op Plan:   Post-operative Plan: Extubation in OR  Informed Consent: I have reviewed the patients History and Physical, chart, labs and discussed the procedure including the risks, benefits and alternatives for the proposed anesthesia with the patient or authorized representative who has indicated his/her understanding and acceptance.     Dental advisory given  Plan Discussed with: CRNA  Anesthesia Plan Comments:         Anesthesia Quick Evaluation

## 2019-12-05 NOTE — Progress Notes (Signed)
Subjective: Patient reports bladder pain and spasms.  Was leaking around catheter.  Objective: Vital signs in last 24 hours: Temp:  [98.2 F (36.8 C)-98.4 F (36.9 C)] 98.2 F (36.8 C) (09/24 0430) Pulse Rate:  [52-76] 52 (09/24 0430) Resp:  [18-19] 18 (09/24 0430) BP: (95-158)/(54-82) 128/76 (09/24 0430) SpO2:  [98 %-100 %] 100 % (09/24 0430)  Intake/Output from previous day: 09/23 0701 - 09/24 0700 In: 36357.1 [P.O.:240; I.V.:112.1] Out: 41962 [Urine:49005] Intake/Output this shift: No intake/output data recorded.  Physical Exam:  No acute distress Abdomen-soft and nontender  Foley catheter irrigated and I got a few clots.  Urine bloody.  CBI was held due to bladder spasms and clots and leaking around Foley.  Irrigation was equal in and out, but patient uncomfortable.  I did a bladder scan and the bladder was decompressed and not detected.  Lab Results: Recent Labs    12/04/19 1441 12/04/19 2030 12/05/19 0235  HGB 10.1* 9.6* 8.3*  HCT 30.7* 29.7* 25.0*   BMET Recent Labs    12/04/19 0743 12/05/19 0235  NA 133* 140  K 4.4 3.9  CL 99 109  CO2 23 25  GLUCOSE 148* 100*  BUN 21 14  CREATININE 1.73* 1.27*  CALCIUM 8.4* 8.0*   Recent Labs    12/04/19 0743 12/05/19 0235  INR 3.8* 3.9*   No results for input(s): LABURIN in the last 72 hours. Results for orders placed or performed during the hospital encounter of 12/04/19  Respiratory Panel by RT PCR (Flu A&B, Covid) - Nasopharyngeal Swab     Status: None   Collection Time: 12/04/19 11:11 AM   Specimen: Nasopharyngeal Swab  Result Value Ref Range Status   SARS Coronavirus 2 by RT PCR NEGATIVE NEGATIVE Final    Comment: (NOTE) SARS-CoV-2 target nucleic acids are NOT DETECTED.  The SARS-CoV-2 RNA is generally detectable in upper respiratoy specimens during the acute phase of infection. The lowest concentration of SARS-CoV-2 viral copies this assay can detect is 131 copies/mL. A negative result does not  preclude SARS-Cov-2 infection and should not be used as the sole basis for treatment or other patient management decisions. A negative result may occur with  improper specimen collection/handling, submission of specimen other than nasopharyngeal swab, presence of viral mutation(s) within the areas targeted by this assay, and inadequate number of viral copies (<131 copies/mL). A negative result must be combined with clinical observations, patient history, and epidemiological information. The expected result is Negative.  Fact Sheet for Patients:  PinkCheek.be  Fact Sheet for Healthcare Providers:  GravelBags.it  This test is no t yet approved or cleared by the [place] FDA and  has been authorized for detection and/or diagnosis of SARS-CoV-2 by FDA under an Emergency Use Authorization (EUA). This EUA will remain  in effect (meaning this test can be used) for the duration of the COVID-19 declaration under Section 564(b)(1) of the Act, 21 U.S.C. section 360bbb-3(b)(1), unless the authorization is terminated or revoked sooner.     Influenza A by PCR NEGATIVE NEGATIVE Final   Influenza B by PCR NEGATIVE NEGATIVE Final    Comment: (NOTE) The Xpert Xpress SARS-CoV-2/FLU/RSV assay is intended as an aid in  the diagnosis of influenza from Nasopharyngeal swab specimens and  should not be used as a sole basis for treatment. Nasal washings and  aspirates are unacceptable for Xpert Xpress SARS-CoV-2/FLU/RSV  testing.  Fact Sheet for Patients: PinkCheek.be  Fact Sheet for Healthcare Providers: GravelBags.it  This test is not yet  approved or cleared by the [place] and  has been authorized for detection and/or diagnosis of SARS-CoV-2 by  FDA under an Emergency Use Authorization (EUA). This EUA will remain  in effect (meaning this test can be used) for the  duration of the  Covid-19 declaration under Section 564(b)(1) of the Act, 21  U.S.C. section 360bbb-3(b)(1), unless the authorization is  terminated or revoked. Performed at [facility], [place] [address], [place], [place] [address]     Studies/Results: CT ABDOMEN PELVIS W CONTRAST  Result Date: 12/04/2019 CLINICAL DATA:  Suprapubic abdominal pain.  Gross hematuria EXAM: CT ABDOMEN AND PELVIS WITH CONTRAST TECHNIQUE: Multidetector CT imaging of the abdomen and pelvis was performed using the standard protocol following bolus administration of intravenous contrast. CONTRAST:  92mL OMNIPAQUE IOHEXOL 300 MG/ML  SOLN COMPARISON:  01/06/2015 FINDINGS: Lower chest: Lung bases are clear. No effusions. Heart is normal size. Hepatobiliary: Scattered hypodensities in the liver are stable since prior study, likely cysts. Gallbladder unremarkable. Pancreas: No focal abnormality or ductal dilatation. Spleen: No focal abnormality.  Normal size. Adrenals/Urinary Tract: Small cyst in the upper pole of the right kidney, stable. No stones or hydronephrosis. Adrenal glands are unremarkable. Foley catheter is present within the bladder. Gas and soft tissue density material noted within the bladder, likely blood clot. Stomach/Bowel: Sigmoid diverticulosis. No active diverticulitis. Normal appendix. Stomach and small bowel decompressed, unremarkable. Vascular/Lymphatic: No evidence of aneurysm or adenopathy. Reproductive: No visible focal abnormality.  Prior prostatectomy. Other: No free fluid or free air. Musculoskeletal: No acute bony abnormality. IMPRESSION: No renal or ureteral stones.  No hydronephrosis. Soft tissue density within the bladder likely reflects blood clots. Foley catheter in place. Prior prostatectomy. Sigmoid diverticulosis.  No active diverticulitis. Electronically Signed   By: Rolm Baptise [name]   On: 12/04/2019 10:51    Assessment/Plan:  Gross hematuria-likely from hemorrhagic  cystitis.  Patient is clearly failing CBI trial. I discussed with the patient the nature, potential benefits, risks and alternatives to cystoscopy, clot evacuation, fulguration, including side effects of the proposed treatment, the likelihood of the patient achieving the goals of the procedure, and any potential problems that might occur during the procedure or recuperation.  We discussed we may not find a specific bleeding point, but he may have more diffuse mucosal bleeding.  It will be helpful for management going forward to have staged his bladder. He continue Vantin which is reasonable in the event a UTI started the cascade of bleeding and to cover for OR procedures. All questions answered. Patient elects to proceed.   Supratherapeutic INR-Coumadin was held last night, but INR slightly higher today.  I will communicate with hospitalist plan for OR and see if possible reversal of the Coumadin is safe. If it is not safe we can continue supportive care with blood transfusions or consider other options depending on what we find at cystoscopy.      LOS: 1 day   Glenn Logan 12/05/2019, 7:29 AM

## 2019-12-05 NOTE — Progress Notes (Signed)
Pt's catheter clogged up and pt unable to pass urine. Pt also complaining of urine overflow around his catheter. Assisted bedside RN in hand irrigation.  Received Glenn Logan red bloody urine with clots present.  Pt stated he felt much better after irrigation and [name] bloody urine draining in foley catheter.

## 2019-12-05 NOTE — Transfer of Care (Signed)
Immediate Anesthesia Transfer of Care Note  Patient: Glenn Logan  Procedure(s) Performed: CYSTOSCOPY WITH FULGERATION (N/A )  Patient Location: PACU  Anesthesia Type:General  Level of Consciousness: awake, alert  and oriented  Airway & Oxygen Therapy: Patient Spontanous Breathing and Patient connected to face mask oxygen  Post-op Assessment: Report given to RN and Post -op Vital signs reviewed and stable  Post vital signs: Reviewed and stable  Last Vitals:  Vitals Value Taken Time  BP 105/60 12/05/19 1505  Temp    Pulse 80 12/05/19 1508  Resp 14 12/05/19 1508  SpO2 100 % 12/05/19 1508  Vitals shown include unvalidated device data.  Last Pain:  Vitals:   12/05/19 1345  TempSrc: Oral  PainSc:       Patients Stated Pain Goal: 0 (37/16/96 7893)  Complications: No complications documented.

## 2019-12-05 NOTE — Op Note (Addendum)
Preoperative diagnosis: Gross hematuria Postoperative diagnosis gross hematuria, hemorrhagic cystitis  Procedure: Cystoscopy, fulguration of bleeding vessels, left bladder, left bladder neck  Surgeon: Junious Silk  Anesthesia: General  Indication for procedure: Glenn Logan is a 74 year old male with history of radical retropubic prostatectomy and salvage radiation and hemorrhagic cystitis.  He presented with gross hematuria and clot retention.  He was started on CBI and the catheter irrigated.  He failed CBI with continued bleeding, clot retention.  I discussed with the patient preop he is free to follow-up at the [place] or I am happy to see him back and we could consider hyperbaric oxygen which has been shown to help prevent episodes of hemorrhagic cystitis.  Findings: On exam under anesthesia the penis was circumcised without mass or lesion.  Meatus normal.  Glans normal.  Scrotum was normal.  On cystoscopy the urethra was unremarkable, prostate was absent but interestingly the verumontanum was present.  Trigone and ureteral orifice ease identified.  There was heavy telangiectatic blood vessels and changes around the bladder floor bladder neck and lateral walls of the bladder from prior radiation.  Once the clots were evacuated there was were 3 specific active bleeding vessels.  2 of these were on the left bladder wall and one on the left bladder neck.  These were fulgurated and there was no other bleeding and hemostasis was excellent.  He was observed for several minutes and again no other bleeding.  Description of procedure: After consent was obtained patient brought to the operating room.  After adequate anesthesia he was placed lithotomy position and prepped and draped in the usual sterile fashion.  Timeout was performed to confirm the patient and procedure.  The cystoscope was passed per urethra and clots were encountered.  Toomey syringe was used to evacuate about 300 cc of clot.  Reinspection of the  bladder noted there to be 3 actively bleeding vessels.  A Bugbee electrode was passed in each vessel was fulgurated.  No other bleeding was noted.  Otherwise bladder appeared normal without any evidence of perforation.  The scope was removed. Lidocaine jelly instilled per urethra and a 20 [place] two-way Foley catheter.  Drainage was clear.  A BNO suppository was placed.  Is awakened taken recovery room in stable condition.  Complications: None  Blood loss: Minimal  Specimens: None  Drains: 20 [place] two-way catheter  Disposition: Patient stable to PACU

## 2019-12-06 ENCOUNTER — Encounter (HOSPITAL_COMMUNITY): Payer: Self-pay | Admitting: Urology

## 2019-12-06 LAB — PREPARE FRESH FROZEN PLASMA
Unit division: 0
Unit division: 0

## 2019-12-06 LAB — CBC
HCT: 19.7 % — ABNORMAL LOW (ref 39.0–52.0)
Hemoglobin: 6.3 g/dL — CL (ref 13.0–17.0)
MCH: 31 pg (ref 26.0–34.0)
MCHC: 32 g/dL (ref 30.0–36.0)
MCV: 97 fL (ref 80.0–100.0)
Platelets: 183 10*3/uL (ref 150–400)
RBC: 2.03 MIL/uL — ABNORMAL LOW (ref 4.22–5.81)
RDW: 15.2 % (ref 11.5–15.5)
WBC: 14.2 10*3/uL — ABNORMAL HIGH (ref 4.0–10.5)
nRBC: 0.1 % (ref 0.0–0.2)

## 2019-12-06 LAB — BASIC METABOLIC PANEL
Anion gap: 7 (ref 5–15)
BUN: 11 mg/dL (ref 8–23)
CO2: 24 mmol/L (ref 22–32)
Calcium: 8.1 mg/dL — ABNORMAL LOW (ref 8.9–10.3)
Chloride: 110 mmol/L (ref 98–111)
Creatinine, Ser: 1.1 mg/dL (ref 0.61–1.24)
GFR calc Af Amer: >60 mL/min (ref >60)
GFR calc non Af Amer: >60 mL/min (ref >60)
Glucose, Bld: 131 mg/dL — ABNORMAL HIGH (ref 70–99)
Potassium: 4 mmol/L (ref 3.5–5.1)
Sodium: 141 mmol/L (ref 135–145)

## 2019-12-06 LAB — BPAM FFP
Blood Product Expiration Date: 202109292359
Blood Product Expiration Date: 202109292359
ISSUE DATE / TIME: 202109241129
ISSUE DATE / TIME: 202109241250
Unit Type and Rh: 5100
Unit Type and Rh: 5100

## 2019-12-06 LAB — GLUCOSE, CAPILLARY
Glucose-Capillary: 102 mg/dL — ABNORMAL HIGH (ref 70–99)
Glucose-Capillary: 114 mg/dL — ABNORMAL HIGH (ref 70–99)
Glucose-Capillary: 106 mg/dL — ABNORMAL HIGH (ref 70–99)

## 2019-12-06 LAB — HEMOGLOBIN AND HEMATOCRIT, BLOOD
HCT: 21.6 % — ABNORMAL LOW (ref 39.0–52.0)
Hemoglobin: 7.2 g/dL — ABNORMAL LOW (ref 13.0–17.0)

## 2019-12-06 LAB — PREPARE RBC (CROSSMATCH)

## 2019-12-06 MED ORDER — SODIUM CHLORIDE 0.9% IV SOLUTION: Freq: Once | INTRAVENOUS | Status: AC

## 2019-12-06 MED ORDER — SODIUM CHLORIDE 0.9 % IV BOLUS
1000.0000 mL | Freq: Once | INTRAVENOUS | Status: AC
  Administered 2019-12-06: 1000 mL via INTRAVENOUS

## 2019-12-06 NOTE — Progress Notes (Signed)
Patient asymptomatic, MD notified of low BP, Patient is receiving blood transfusion and MD ordered a bolus. After rechecking vitals 15 minute post transfusion BP went up to an acceptable range. Will continue to monitor patient closely.

## 2019-12-06 NOTE — Progress Notes (Addendum)
PROGRESS NOTE    Glenn Logan  YYQ:[id number] DOB: 03/10/1946 DOA: 12/04/2019 PCP: [place]    Chief Complaint  Patient presents with  . Hematuria    Brief Narrative:  74 year old gentleman prior history of prostate cancer, diabetes mellitus, history of DVT on Coumadin hypertension presents to ED for hematuria. On arrival to ED he was found to have supratherapeutic INR.  Coumadin was held, but patient continued to have active bleeding with clots and INR remained elevated at 3.9. Urology consulted recommended cystoscopy with CBI and required INR to be less than 2. 2 units of FFP were ordered after discussing with the patient, for reversal of Coumadin for cystoscopy later today. Patient seen and examined this morning no new complaints other than hematuria. Assessment & Plan:   Active Problems:   Hematuria   Gross hematuria possibly secondary to hemorrhagic cystitis Not improving with CBI trial.  Underwent cystoscopy Appreciate Dr. Junious Silk assistance Pain control and urine has cleared of hematuria.     Hypotension, asymptomatic Blood pressure parameters improved with fluid bolus   Type 2 diabetes mellitus CBG (last 3)  Recent Labs    12/05/19 2105 12/06/19 0737 12/06/19 1202  GLUCAP 159* 102* 114*   Continue sliding scale insulin no changes in medications.    GERD  Stable.   H/o DVT on coumadin.  Coumadin held for hematuria. Pt wants to stop coumadin , and start NOAC'S when hematuria resolves.   Acute anemia of blood loss possibly from hematuria. Baseline hemoglobin around 10 dropped to 6.3 today possibly from hematuria. S/p 1 unit of PRBC transfusion repeat hemoglobin this evening and transfuse to keep hemoglobin greater than 7.   DVT prophylaxis: SCDs Code Status: (Full code Family Communication: none at bedside.  Disposition:   Status is: Inpatient  Remains inpatient appropriate because:Ongoing diagnostic testing needed not  appropriate for outpatient work up, IV treatments appropriate due to intensity of illness or inability to take PO and Inpatient level of care appropriate due to severity of illness   Dispo: The patient is from: Home              Anticipated d/c is to: pending.               Anticipated d/c date is: 2 days              Patient currently is not medically stable to d/c.       Consultants:   Urology.   Procedures: cystoscopy Antimicrobials:  Antibiotics Given (last 72 hours)    Date/Time Action Medication Dose   12/04/19 2131 Given   cefpodoxime (VANTIN) tablet 200 mg 200 mg   12/05/19 1201 Given   cefpodoxime (VANTIN) tablet 200 mg 200 mg   12/05/19 2314 Given  [Patient refused until after 2300]   cefpodoxime (VANTIN) tablet 200 mg 200 mg      Subjective: Some abd cramps with blood clots.   Objective: Vitals:   12/06/19 0931 12/06/19 1139 12/06/19 1200 12/06/19 1406  BP: (!) 103/56 (!) 61/49 (!) 94/48 100/63  Pulse: 87 86 82 71  Resp: 14 16 16 18   Temp: 98.2 F (36.8 C) 98.3 F (36.8 C) 98.4 F (36.9 C) 98.4 F (36.9 C)  TempSrc: Oral Oral Oral Oral  SpO2: 100% 100% 100% 100%  Weight:      Height:        Intake/Output Summary (Last 24 hours) at 12/06/2019 1606 Last data filed at 12/06/2019 1403 Gross per 24 hour  Intake 4469.01 ml  Output 1675 ml  Net 2794.01 ml   Filed Weights   12/04/19 0634 12/05/19 1302  Weight: 62.1 kg 62.1 kg    Examination:  General exam: Alert and comfortable Respiratory system: Clear to auscultation bilaterally, no wheezing or rhonchi Cardiovascular system: S1-S2 heard, regular rate rhythm, no JVD, no pedal edema  gastrointestinal system: Abdomen is soft nontender, nondistended, bowel sounds good Central nervous system: Alert and oriented, grossly normal  extremities: No pedal edema Skin: No rashes seen Psychiatry: Mood is appropriate    Data Reviewed: I have personally reviewed following labs and imaging  studies  CBC: Recent Labs  Lab 12/04/19 0743 12/04/19 0743 12/04/19 1441 12/04/19 2030 12/05/19 0235 12/05/19 0814 12/06/19 0916  WBC 11.3*  --   --   --  8.2  --  14.2*  NEUTROABS 9.8*  --   --   --   --   --   --   HGB 10.5*   < > 10.1* 9.6* 8.3* 8.1* 6.3*  HCT 31.8*   < > 30.7* 29.7* 25.0* 25.3* 19.7*  MCV 92.4  --   --   --  95.1  --  97.0  PLT 268  --   --   --  180  --  183   < > = values in this interval not displayed.    Basic Metabolic Panel: Recent Labs  Lab 12/04/19 0743 12/05/19 0235 12/06/19 0916  NA 133* 140 141  K 4.4 3.9 4.0  CL 99 109 110  CO2 23 25 24   GLUCOSE 148* 100* 131*  BUN 21 14 11   CREATININE 1.73* 1.27* 1.10  CALCIUM 8.4* 8.0* 8.1*    GFR: Estimated Creatinine Clearance: 53.3 mL/min (by C-G formula based on SCr of 1.1 mg/dL).  Liver Function Tests: Recent Labs  Lab 12/05/19 0235  AST 16  ALT 13  ALKPHOS 29*  BILITOT 0.6  PROT 5.3*  ALBUMIN 3.0*    CBG: Recent Labs  Lab 12/05/19 1135 12/05/19 1658 12/05/19 2105 12/06/19 0737 12/06/19 1202  GLUCAP 87 80 159* 102* 114*     Recent Results (from the past 240 hour(s))  Respiratory Panel by RT PCR (Flu A&B, Covid) - Nasopharyngeal Swab     Status: None   Collection Time: 12/04/19 11:11 AM   Specimen: Nasopharyngeal Swab  Result Value Ref Range Status   SARS Coronavirus 2 by RT PCR NEGATIVE NEGATIVE Final    Comment: (NOTE) SARS-CoV-2 target nucleic acids are NOT DETECTED.  The SARS-CoV-2 RNA is generally detectable in upper respiratoy specimens during the acute phase of infection. The lowest concentration of SARS-CoV-2 viral copies this assay can detect is 131 copies/mL. A negative result does not preclude SARS-Cov-2 infection and should not be used as the sole basis for treatment or other patient management decisions. A negative result may occur with  improper specimen collection/handling, submission of specimen other than nasopharyngeal swab, presence of viral  mutation(s) within the areas targeted by this assay, and inadequate number of viral copies (<131 copies/mL). A negative result must be combined with clinical observations, patient history, and epidemiological information. The expected result is Negative.  Fact Sheet for Patients:  PinkCheek.be  Fact Sheet for Healthcare Providers:  GravelBags.it  This test is no t yet approved or cleared by the [place] FDA and  has been authorized for detection and/or diagnosis of SARS-CoV-2 by FDA under an Emergency Use Authorization (EUA). This EUA will remain  in effect (meaning this test  can be used) for the duration of the COVID-19 declaration under Section 564(b)(1) of the Act, 21 U.S.C. section 360bbb-3(b)(1), unless the authorization is terminated or revoked sooner.     Influenza A by PCR NEGATIVE NEGATIVE Final   Influenza B by PCR NEGATIVE NEGATIVE Final    Comment: (NOTE) The Xpert Xpress SARS-CoV-2/FLU/RSV assay is intended as an aid in  the diagnosis of influenza from Nasopharyngeal swab specimens and  should not be used as a sole basis for treatment. Nasal washings and  aspirates are unacceptable for Xpert Xpress SARS-CoV-2/FLU/RSV  testing.  Fact Sheet for Patients: PinkCheek.be  Fact Sheet for Healthcare Providers: GravelBags.it  This test is not yet approved or cleared by the [place] FDA and  has been authorized for detection and/or diagnosis of SARS-CoV-2 by  FDA under an Emergency Use Authorization (EUA). This EUA will remain  in effect (meaning this test can be used) for the duration of the  Covid-19 declaration under Section 564(b)(1) of the Act, 21  U.S.C. section 360bbb-3(b)(1), unless the authorization is  terminated or revoked. Performed at [facility], [place] [address], [place], [place] [address]          Radiology  Studies: No results found.      Scheduled Meds: . Chlorhexidine Gluconate Cloth  6 each Topical Daily  . gabapentin  300 mg Oral TID  . insulin aspart  0-9 Units Subcutaneous TID WC  . loratadine  10 mg Oral Daily  . mometasone-formoterol  2 puff Inhalation BID  . pantoprazole  40 mg Oral Daily  . sertraline  100 mg Oral Daily   Continuous Infusions: . sodium chloride 125 mL/hr at 12/06/19 0936     LOS: 2 days        Hosie Poisson, MD [facility]   To contact the attending provider between 7A-7P or the covering provider during after hours 7P-7A, please log into the web site [email or web address] and access using universal [place] password for that web site. If you do not have the password, please call the hospital operator.  12/06/2019, 4:06 PM

## 2019-12-06 NOTE — Progress Notes (Signed)
Critical lab value hgb 6.3 MD notified, awaiting orders.

## 2019-12-07 LAB — CBC
HCT: 21 % — ABNORMAL LOW (ref 39.0–52.0)
Hemoglobin: 7 g/dL — ABNORMAL LOW (ref 13.0–17.0)
MCH: 31.7 pg (ref 26.0–34.0)
MCHC: 33.3 g/dL (ref 30.0–36.0)
MCV: 95 fL (ref 80.0–100.0)
Platelets: 168 10*3/uL (ref 150–400)
RBC: 2.21 MIL/uL — ABNORMAL LOW (ref 4.22–5.81)
RDW: 15.8 % — ABNORMAL HIGH (ref 11.5–15.5)
WBC: 11 10*3/uL — ABNORMAL HIGH (ref 4.0–10.5)
nRBC: 0.7 % — ABNORMAL HIGH (ref 0.0–0.2)

## 2019-12-07 LAB — BASIC METABOLIC PANEL
Anion gap: 7 (ref 5–15)
BUN: 11 mg/dL (ref 8–23)
CO2: 26 mmol/L (ref 22–32)
Calcium: 8 mg/dL — ABNORMAL LOW (ref 8.9–10.3)
Chloride: 112 mmol/L — ABNORMAL HIGH (ref 98–111)
Creatinine, Ser: 1.29 mg/dL — ABNORMAL HIGH (ref 0.61–1.24)
GFR calc Af Amer: >60 mL/min (ref >60)
GFR calc non Af Amer: 55 mL/min — ABNORMAL LOW (ref >60)
Glucose, Bld: 94 mg/dL (ref 70–99)
Potassium: 3.9 mmol/L (ref 3.5–5.1)
Sodium: 145 mmol/L (ref 135–145)

## 2019-12-07 LAB — PREPARE RBC (CROSSMATCH)

## 2019-12-07 LAB — HEMOGLOBIN AND HEMATOCRIT, BLOOD
HCT: 26.3 % — ABNORMAL LOW (ref 39.0–52.0)
Hemoglobin: 8.8 g/dL — ABNORMAL LOW (ref 13.0–17.0)

## 2019-12-07 LAB — GLUCOSE, CAPILLARY
Glucose-Capillary: 96 mg/dL (ref 70–99)
Glucose-Capillary: 112 mg/dL — ABNORMAL HIGH (ref 70–99)
Glucose-Capillary: 83 mg/dL (ref 70–99)
Glucose-Capillary: 141 mg/dL — ABNORMAL HIGH (ref 70–99)

## 2019-12-07 MED ORDER — SODIUM CHLORIDE 0.9% IV SOLUTION: Freq: Once | INTRAVENOUS | Status: AC

## 2019-12-07 NOTE — Progress Notes (Signed)
PROGRESS NOTE    Glenn Logan  WYO:[id number] DOB: Oct 04, 1945 DOA: 12/04/2019 PCP: [place]    Chief Complaint  Patient presents with  . Hematuria    Brief Narrative:  74 year old gentleman prior history of prostate cancer, diabetes mellitus, history of DVT on Coumadin hypertension presents to ED for hematuria. On arrival to ED he was found to have supratherapeutic INR.  Coumadin was held, but patient continued to have active bleeding with clots and INR remained elevated at 3.9. Urology consulted recommended cystoscopy with CBI and required INR to be less than 2. 2 units of FFP were ordered after discussing with the patient, for reversal of Coumadin for cystoscopy .  Patient underwent cystoscopy and fulguration of the bleeding vessels in the left side of the bladder and left bladder neck by Dr. Junious Silk on 12/05/2019.  His bleeding has stopped for approximately 24 to 36 hours.  Started having hematuria this morning and his hemoglobin has dropped to 7 again.  1 unit of PRBC transfusion ordered.  Patient reports some mild lower abdominal discomfort and heaviness.  Assessment & Plan:   Active Problems:   Hematuria   Gross hematuria possibly secondary to hemorrhagic cystitis Not improving with CBI trial.  Underwent cystoscopy and fulguration of bleeding vessels in the left side of the bladder and left bladder neck by Dr. Junious Silk on 12/05/2019 Appreciate Dr. Junious Silk assistance Persistent hematuria this morning with some abdominal discomfort.     Hypotension, asymptomatic Blood pressure parameters have improved   Type 2 diabetes mellitus CBG (last 3)  Recent Labs    12/06/19 1629 12/07/19 0724 12/07/19 1149  GLUCAP 106* 96 112*   Continue sliding scale insulin No changes in medication  GERD  Stable.   H/o DVT on coumadin.  Coumadin held for hematuria. Pt wants to stop coumadin , and start NOAC'S when hematuria resolves.   Acute anemia of blood loss  possibly from hematuria. Baseline hemoglobin around 10 dropped to 6.3 today possibly from hematuria. S/p 1 unit of PRBC transfusion, repeat hemoglobin 7 another unit of PRBC transfusion ordered.   DVT prophylaxis: SCDs Code Status: (Full code Family Communication: none at bedside.  Disposition:   Status is: Inpatient  Remains inpatient appropriate because:Ongoing diagnostic testing needed not appropriate for outpatient work up, IV treatments appropriate due to intensity of illness or inability to take PO and Inpatient level of care appropriate due to severity of illness   Dispo: The patient is from: Home              Anticipated d/c is to: pending.               Anticipated d/c date is: 2 days              Patient currently is not medically stable to d/c.       Consultants:   Urology.   Procedures: cystoscopy Antimicrobials:  Antibiotics Given (last 72 hours)    Date/Time Action Medication Dose   12/04/19 2131 Given   cefpodoxime (VANTIN) tablet 200 mg 200 mg   12/05/19 1201 Given   cefpodoxime (VANTIN) tablet 200 mg 200 mg   12/05/19 2314 Given  [Patient refused until after 2300]   cefpodoxime (VANTIN) tablet 200 mg 200 mg      Subjective: Some abdominal cramps in the lower abdomen, no chest pain or shortness of breath  Objective: Vitals:   12/07/19 1145 12/07/19 1148 12/07/19 1213 12/07/19 1538  BP: 114/75 114/75 120/65 129/79  Pulse: 71 71 66 65  Resp: 18 18 18 18   Temp: 98.1 F (36.7 C) 98.1 F (36.7 C) 98.6 F (37 C) 98.3 F (36.8 C)  TempSrc: Oral Oral Oral Oral  SpO2: 99% 99% 100% 99%  Weight:      Height:        Intake/Output Summary (Last 24 hours) at 12/07/2019 1555 Last data filed at 12/07/2019 1538 Gross per 24 hour  Intake 3655.98 ml  Output 3300 ml  Net 355.98 ml   Filed Weights   12/04/19 0634 12/05/19 1302  Weight: 62.1 kg 62.1 kg    Examination:  General exam: Appears uncomfortable but not in any kind of distress. Respiratory  system: Clear to auscultation bilaterally, no wheezing or rhonchi Cardiovascular system: S S1-S2 heard, regular rate rhythm, no JVD, no pedal edema gastrointestinal system: Abdomen is soft, nontender, bowel sounds normal Central nervous system: Alert and oriented, grossly nonfocal  extremities: No pedal edema Skin: No rashes seen Psychiatry mood is appropriate    Data Reviewed: I have personally reviewed following labs and imaging studies  CBC: Recent Labs  Lab 12/04/19 0743 12/04/19 1441 12/05/19 0235 12/05/19 0814 12/06/19 0916 12/06/19 1629 12/07/19 0415  WBC 11.3*  --  8.2  --  14.2*  --  11.0*  NEUTROABS 9.8*  --   --   --   --   --   --   HGB 10.5*   < > 8.3* 8.1* 6.3* 7.2* 7.0*  HCT 31.8*   < > 25.0* 25.3* 19.7* 21.6* 21.0*  MCV 92.4  --  95.1  --  97.0  --  95.0  PLT 268  --  180  --  183  --  168   < > = values in this interval not displayed.    Basic Metabolic Panel: Recent Labs  Lab 12/04/19 0743 12/05/19 0235 12/06/19 0916 12/07/19 0415  NA 133* 140 141 145  K 4.4 3.9 4.0 3.9  CL 99 109 110 112*  CO2 23 25 24 26   GLUCOSE 148* 100* 131* 94  BUN 21 14 11 11   CREATININE 1.73* 1.27* 1.10 1.29*  CALCIUM 8.4* 8.0* 8.1* 8.0*    GFR: Estimated Creatinine Clearance: 45.5 mL/min (A) (by C-G formula based on SCr of 1.29 mg/dL (H)).  Liver Function Tests: Recent Labs  Lab 12/05/19 0235  AST 16  ALT 13  ALKPHOS 29*  BILITOT 0.6  PROT 5.3*  ALBUMIN 3.0*    CBG: Recent Labs  Lab 12/06/19 0737 12/06/19 1202 12/06/19 1629 12/07/19 0724 12/07/19 1149  GLUCAP 102* 114* 106* 96 112*     Recent Results (from the past 240 hour(s))  Respiratory Panel by RT PCR (Flu A&B, Covid) - Nasopharyngeal Swab     Status: None   Collection Time: 12/04/19 11:11 AM   Specimen: Nasopharyngeal Swab  Result Value Ref Range Status   SARS Coronavirus 2 by RT PCR NEGATIVE NEGATIVE Final    Comment: (NOTE) SARS-CoV-2 target nucleic acids are NOT DETECTED.  The  SARS-CoV-2 RNA is generally detectable in upper respiratoy specimens during the acute phase of infection. The lowest concentration of SARS-CoV-2 viral copies this assay can detect is 131 copies/mL. A negative result does not preclude SARS-Cov-2 infection and should not be used as the sole basis for treatment or other patient management decisions. A negative result may occur with  improper specimen collection/handling, submission of specimen other than nasopharyngeal swab, presence of viral mutation(s) within the areas targeted by this assay,  and inadequate number of viral copies (<131 copies/mL). A negative result must be combined with clinical observations, patient history, and epidemiological information. The expected result is Negative.  Fact Sheet for Patients:  PinkCheek.be  Fact Sheet for Healthcare Providers:  GravelBags.it  This test is no t yet approved or cleared by the [place] FDA and  has been authorized for detection and/or diagnosis of SARS-CoV-2 by FDA under an Emergency Use Authorization (EUA). This EUA will remain  in effect (meaning this test can be used) for the duration of the COVID-19 declaration under Section 564(b)(1) of the Act, 21 U.S.C. section 360bbb-3(b)(1), unless the authorization is terminated or revoked sooner.     Influenza A by PCR NEGATIVE NEGATIVE Final   Influenza B by PCR NEGATIVE NEGATIVE Final    Comment: (NOTE) The Xpert Xpress SARS-CoV-2/FLU/RSV assay is intended as an aid in  the diagnosis of influenza from Nasopharyngeal swab specimens and  should not be used as a sole basis for treatment. Nasal washings and  aspirates are unacceptable for Xpert Xpress SARS-CoV-2/FLU/RSV  testing.  Fact Sheet for Patients: PinkCheek.be  Fact Sheet for Healthcare Providers: GravelBags.it  This test is not yet approved or cleared  by the [place] FDA and  has been authorized for detection and/or diagnosis of SARS-CoV-2 by  FDA under an Emergency Use Authorization (EUA). This EUA will remain  in effect (meaning this test can be used) for the duration of the  Covid-19 declaration under Section 564(b)(1) of the Act, 21  U.S.C. section 360bbb-3(b)(1), unless the authorization is  terminated or revoked. Performed at [facility], [place] [address], [place], [place] [address]          Radiology Studies: No results found.      Scheduled Meds: . Chlorhexidine Gluconate Cloth  6 each Topical Daily  . gabapentin  300 mg Oral TID  . insulin aspart  0-9 Units Subcutaneous TID WC  . loratadine  10 mg Oral Daily  . mometasone-formoterol  2 puff Inhalation BID  . pantoprazole  40 mg Oral Daily  . sertraline  100 mg Oral Daily   Continuous Infusions: . sodium chloride 125 mL/hr at 12/07/19 0600     LOS: 3 days        Hosie Poisson, MD [facility]   To contact the attending provider between 7A-7P or the covering provider during after hours 7P-7A, please log into the web site [email or web address] and access using universal [place] password for that web site. If you do not have the password, please call the hospital operator.  12/07/2019, 3:55 PM

## 2019-12-07 NOTE — Progress Notes (Signed)
Pt c/o leaking from the foley catheter, upon assessment noted pink tinged drainage in the bed pads. Dr Gloriann Loan is paged and ordered to flush the catheter. Orders carried out. MD Karleen Hampshire is also notified via secure chat. Foley catheter flushed, its draining well. Pt felt comfortable.

## 2019-12-08 LAB — TYPE AND SCREEN
ABO/RH(D): O POS
Antibody Screen: NEGATIVE
Unit division: 0
Unit division: 0

## 2019-12-08 LAB — BPAM RBC
Blood Product Expiration Date: 202110262359
Blood Product Expiration Date: 202110262359
ISSUE DATE / TIME: 202109251143
ISSUE DATE / TIME: 202109261140
Unit Type and Rh: 5100
Unit Type and Rh: 5100

## 2019-12-08 LAB — CBC
HCT: 26.7 % — ABNORMAL LOW (ref 39.0–52.0)
Hemoglobin: 8.7 g/dL — ABNORMAL LOW (ref 13.0–17.0)
MCH: 30.2 pg (ref 26.0–34.0)
MCHC: 32.6 g/dL (ref 30.0–36.0)
MCV: 92.7 fL (ref 80.0–100.0)
Platelets: 178 10*3/uL (ref 150–400)
RBC: 2.88 MIL/uL — ABNORMAL LOW (ref 4.22–5.81)
RDW: 17 % — ABNORMAL HIGH (ref 11.5–15.5)
WBC: 8.2 10*3/uL (ref 4.0–10.5)
nRBC: 1.8 % — ABNORMAL HIGH (ref 0.0–0.2)

## 2019-12-08 LAB — URINALYSIS, ROUTINE W REFLEX MICROSCOPIC
Bacteria, UA: NONE SEEN
Bilirubin Urine: NEGATIVE
Glucose, UA: NEGATIVE mg/dL
Ketones, ur: NEGATIVE mg/dL
Nitrite: NEGATIVE
Protein, ur: 30 mg/dL — AB
RBC / HPF: >50 RBC/hpf — ABNORMAL HIGH (ref 0–5)
Specific Gravity, Urine: 1.005 (ref 1.005–1.030)
pH: 8 (ref 5.0–8.0)

## 2019-12-08 LAB — GLUCOSE, CAPILLARY
Glucose-Capillary: 74 mg/dL (ref 70–99)
Glucose-Capillary: 112 mg/dL — ABNORMAL HIGH (ref 70–99)
Glucose-Capillary: 95 mg/dL (ref 70–99)
Glucose-Capillary: 123 mg/dL — ABNORMAL HIGH (ref 70–99)

## 2019-12-08 LAB — BASIC METABOLIC PANEL
Anion gap: 6 (ref 5–15)
BUN: 10 mg/dL (ref 8–23)
CO2: 25 mmol/L (ref 22–32)
Calcium: 8.1 mg/dL — ABNORMAL LOW (ref 8.9–10.3)
Chloride: 111 mmol/L (ref 98–111)
Creatinine, Ser: 1.17 mg/dL (ref 0.61–1.24)
GFR calc Af Amer: >60 mL/min (ref >60)
GFR calc non Af Amer: >60 mL/min (ref >60)
Glucose, Bld: 86 mg/dL (ref 70–99)
Potassium: 3.8 mmol/L (ref 3.5–5.1)
Sodium: 142 mmol/L (ref 135–145)

## 2019-12-08 LAB — PROTIME-INR
INR: 1.2 (ref 0.8–1.2)
Prothrombin Time: 14.9 seconds (ref 11.4–15.2)

## 2019-12-08 MED ORDER — RIVAROXABAN 20 MG PO TABS
20.0000 mg | ORAL_TABLET | Freq: Every day | ORAL | 3 refills | Status: DC
Start: 2019-12-08 — End: 2019-12-09

## 2019-12-08 MED ORDER — RIVAROXABAN 10 MG PO TABS
20.0000 mg | ORAL_TABLET | Freq: Every day | ORAL | Status: DC
  Administered 2019-12-08: 20 mg via ORAL
  Filled 2019-12-08: qty 2

## 2019-12-08 MED ORDER — RIVAROXABAN 20 MG PO TABS
20.0000 mg | ORAL_TABLET | Freq: Every day | ORAL | 3 refills | Status: DC
Start: 2019-12-08 — End: 2019-12-08

## 2019-12-08 NOTE — TOC Progression Note (Addendum)
Transition of Care [facility]) - Progression Note    Patient Details  Name: Glenn Logan MRN: [id number] Date of Birth: Oct 11, 1945  Transition of Care [facility]) CM/SW [place], [place] Phone Number: 12/08/2019, 3:38 PM  Clinical Narrative:    HT[employer] Benefits check complete for Xarelto and Eliquis-45.00 monthly co-pay. Patient reports he is active with [facility] and his medication are cheaper. CSW instructed to fax pt current clinicals/prescription to PCP for physician approval.  [facility] faxed information to [phone].   Expected Discharge Plan: Home/Self Care Barriers to Discharge: Continued Medical Work up  Expected Discharge Plan and Services Expected Discharge Plan: Home/Self Care In-house Referral: Clinical Social Work Discharge Planning Services: NA Post Acute Care Choice: NA Living arrangements for the past 2 months: Single Family Home                 DME Arranged: N/A DME Agency: NA       HH Arranged: NA HH Agency: NA         Social Determinants of Health (SDOH) Interventions    Readmission Risk Interventions No flowsheet data found.

## 2019-12-08 NOTE — Progress Notes (Signed)
PROGRESS NOTE    Glenn Logan  GUY:[id number] DOB: 08-19-1945 DOA: 12/04/2019 PCP: Glenn Logan    Chief Complaint  Patient presents with  . Hematuria    Brief Narrative:  74 year old gentleman prior history of prostate cancer, diabetes mellitus, history of DVT on Coumadin hypertension presents to ED for hematuria. On arrival to ED he was found to have supratherapeutic INR.  Coumadin was held, but patient continued to have active bleeding with clots and INR remained elevated at 3.9. Urology consulted recommended cystoscopy with CBI and required INR to be less than 2. 2 units of FFP were ordered after discussing with the patient, for reversal of Coumadin for cystoscopy .  Patient underwent cystoscopy and fulguration of the bleeding vessels in the left side of the bladder and left bladder neck by Dr. Junious Silk on 12/05/2019.  His bleeding has stopped for approximately 24 to 36 hours.  Started having hematuria this morning and his hemoglobin has dropped to 7 again.  1 unit of PRBC transfusion ordered.   Patient seen and examined at bedside. His hematuria appears to have completely resolved.  Foley has been removed.  Patient would like to be transition to newer anticoagulants.  Xarelto was started today. Assessment & Plan:   Active Problems:   Hematuria   Gross hematuria possibly secondary to hemorrhagic cystitis Not improving with CBI trial.  Underwent cystoscopy and fulguration of bleeding vessels in the left side of the bladder and left bladder neck by Dr. Junious Silk on 12/05/2019 Appreciate Dr. Junious Silk assistance His hematuria appears to have completely resolved.  Foley has been removed.  Patient would like to be transition to newer anticoagulants.  Xarelto was started today. Repeat hemoglobin stable.     Hypotension, asymptomatic Pressure parameters are optimal this morning   Type 2 diabetes mellitus CBG (last 3)  Recent Labs    12/07/19 2119 12/08/19 0753  12/08/19 1153  GLUCAP 141* 74 112*   Continue with sliding scale insulin.  GERD  Stable.   H/o DVT on coumadin.  Patient would like to initiate NOAC's He was started on Xarelto.   Acute anemia of blood loss possibly from hematuria. Baseline hemoglobin around 10 dropped to 6.3 today possibly from hematuria. S/p 1 unit of PRBC transfusion, repeat hemoglobin stable around 10.     DVT prophylaxis: SCDs Code Status: (Full code Family Communication: none at bedside.  Disposition:   Status is: Inpatient  Remains inpatient appropriate because:Ongoing diagnostic testing needed not appropriate for outpatient work up, IV treatments appropriate due to intensity of illness or inability to take PO and Inpatient level of care appropriate due to severity of illness   Dispo: The patient is from: Home              Anticipated d/c is to: pending.               Anticipated d/c date is: 2 days              Patient currently is not medically stable to d/c.       Consultants:   Urology.   Procedures: cystoscopy Antimicrobials:  Antibiotics Given (last 72 hours)    Date/Time Action Medication Dose   12/05/19 2314 Given  [Patient refused until after 2300]   cefpodoxime (VANTIN) tablet 200 mg 200 mg      Subjective: No abdominal discomfort no nausea vomiting  Objective: Vitals:   12/07/19 2117 12/08/19 0543 12/08/19 0910 12/08/19 1402  BP: (!) 160/74 (!) 154/85  [name])  147/80  Pulse: 72 (!) 57  60  Resp: 20 16  18   Temp: 98.2 F (36.8 C) 98 F (36.7 C)  98.1 F (36.7 C)  TempSrc: Oral Oral  Oral  SpO2: 99% 100% 100% 100%  Weight:      Height:        Intake/Output Summary (Last 24 hours) at 12/08/2019 1638 Last data filed at 12/08/2019 1400 Gross per 24 hour  Intake 4051.8 ml  Output 2825 ml  Net 1226.8 ml   Filed Weights   12/04/19 0634 12/05/19 1302  Weight: 62.1 kg 62.1 kg    Examination:  General exam: Alert not in any kind of distress Respiratory system:  Clear to auscultation bilaterally, no wheezing or rhonchi Cardiovascular system: S1-S2 heard, regular rate rhythm, no JVD, no pedal edema gastrointestinal system: Abdomen is soft, nontender, nondistended, bowel sounds normal Central nervous system: Alert and oriented, grossly nonfocal   extremities: No pedal edema or cyanosis Skin: No rashes seen Psychiatry mood is appropriate    Data Reviewed: I have personally reviewed following labs and imaging studies  CBC: Recent Labs  Lab 12/04/19 0743 12/04/19 1441 12/05/19 0235 12/05/19 0814 12/06/19 0916 12/06/19 1629 12/07/19 0415 12/07/19 1726 12/08/19 0411  WBC 11.3*  --  8.2  --  14.2*  --  11.0*  --  8.2  NEUTROABS 9.8*  --   --   --   --   --   --   --   --   HGB 10.5*   < > 8.3*   < > 6.3* 7.2* 7.0* 8.8* 8.7*  HCT 31.8*   < > 25.0*   < > 19.7* 21.6* 21.0* 26.3* 26.7*  MCV 92.4  --  95.1  --  97.0  --  95.0  --  92.7  PLT 268  --  180  --  183  --  168  --  178   < > = values in this interval not displayed.    Basic Metabolic Panel: Recent Labs  Lab 12/04/19 0743 12/05/19 0235 12/06/19 0916 12/07/19 0415 12/08/19 0411  NA 133* 140 141 145 142  K 4.4 3.9 4.0 3.9 3.8  CL 99 109 110 112* 111  CO2 23 25 24 26 25   GLUCOSE 148* 100* 131* 94 86  BUN 21 14 11 11 10   CREATININE 1.73* 1.27* 1.10 1.29* 1.17  CALCIUM 8.4* 8.0* 8.1* 8.0* 8.1*    GFR: Estimated Creatinine Clearance: 50.1 mL/min (by C-G formula based on SCr of 1.17 mg/dL).  Liver Function Tests: Recent Labs  Lab 12/05/19 0235  AST 16  ALT 13  ALKPHOS 29*  BILITOT 0.6  PROT 5.3*  ALBUMIN 3.0*    CBG: Recent Labs  Lab 12/07/19 1149 12/07/19 1624 12/07/19 2119 12/08/19 0753 12/08/19 1153  GLUCAP 112* 83 141* 74 112*     Recent Results (from the past 240 hour(s))  Respiratory Panel by RT PCR (Flu A&B, Covid) - Nasopharyngeal Swab     Status: None   Collection Time: 12/04/19 11:11 AM   Specimen: Nasopharyngeal Swab  Result Value Ref Range  Status   SARS Coronavirus 2 by RT PCR NEGATIVE NEGATIVE Final    Comment: (NOTE) SARS-CoV-2 target nucleic acids are NOT DETECTED.  The SARS-CoV-2 RNA is generally detectable in upper respiratoy specimens during the acute phase of infection. The lowest concentration of SARS-CoV-2 viral copies this assay can detect is 131 copies/mL. A negative result does not preclude SARS-Cov-2 infection and should not  be used as the sole basis for treatment or other patient management decisions. A negative result may occur with  improper specimen collection/handling, submission of specimen other than nasopharyngeal swab, presence of viral mutation(s) within the areas targeted by this assay, and inadequate number of viral copies (<131 copies/mL). A negative result must be combined with clinical observations, patient history, and epidemiological information. The expected result is Negative.  Fact Sheet for Patients:  PinkCheek.be  Fact Sheet for Healthcare Providers:  GravelBags.it  This test is no t yet approved or cleared by the [place] FDA and  has been authorized for detection and/or diagnosis of SARS-CoV-2 by FDA under an Emergency Use Authorization (EUA). This EUA will remain  in effect (meaning this test can be used) for the duration of the COVID-19 declaration under Section 564(b)(1) of the Act, 21 U.S.C. section 360bbb-3(b)(1), unless the authorization is terminated or revoked sooner.     Influenza A by PCR NEGATIVE NEGATIVE Final   Influenza B by PCR NEGATIVE NEGATIVE Final    Comment: (NOTE) The Xpert Xpress SARS-CoV-2/FLU/RSV assay is intended as an aid in  the diagnosis of influenza from Nasopharyngeal swab specimens and  should not be used as a sole basis for treatment. Nasal washings and  aspirates are unacceptable for Xpert Xpress SARS-CoV-2/FLU/RSV  testing.  Fact Sheet for  Patients: PinkCheek.be  Fact Sheet for Healthcare Providers: GravelBags.it  This test is not yet approved or cleared by the [place] FDA and  has been authorized for detection and/or diagnosis of SARS-CoV-2 by  FDA under an Emergency Use Authorization (EUA). This EUA will remain  in effect (meaning this test can be used) for the duration of the  Covid-19 declaration under Section 564(b)(1) of the Act, 21  U.S.C. section 360bbb-3(b)(1), unless the authorization is  terminated or revoked. Performed at [facility], [place] [address], [place], [place] [address]          Radiology Studies: No results found.      Scheduled Meds: . Chlorhexidine Gluconate Cloth  6 each Topical Daily  . gabapentin  300 mg Oral TID  . insulin aspart  0-9 Units Subcutaneous TID WC  . loratadine  10 mg Oral Daily  . mometasone-formoterol  2 puff Inhalation BID  . pantoprazole  40 mg Oral Daily  . rivaroxaban  20 mg Oral Q supper  . sertraline  100 mg Oral Daily   Continuous Infusions: . sodium chloride 125 mL/hr at 12/08/19 0847     LOS: 4 days        Hosie Poisson, MD [facility]   To contact the attending provider between 7A-7P or the covering provider during after hours 7P-7A, please log into the web site [email or web address] and access using universal [place] password for that web site. If you do not have the password, please call the hospital operator.  12/08/2019, 4:38 PM

## 2019-12-08 NOTE — Progress Notes (Signed)
[facility] for Rivaroxaban Indication: Hx DVT  Allergies  Allergen Reactions  . Penicillins Anaphylaxis and Rash    Has patient had a PCN reaction causing immediate rash, facial/tongue/throat swelling, SOB or lightheadedness with hypotension: Yes Has patient had a PCN reaction causing severe rash involving mucus membranes or skin necrosis: Yes Has patient had a PCN reaction that required hospitalization No Has patient had a PCN reaction occurring within the last 10 years: Yes If all of the above answers are "NO", then may proceed with Cephalosporin use. Has patient had a PCN reaction causing immediate rash, facial/tongue/throat swelling, SOB or lightheadedness with hypotension: Yes Has patient had a PCN reaction causing severe rash involving mucus membranes or skin necrosis: Yes Has patient had a PCN reaction that required hospitalization No Has patient had a PCN reaction occurring within the last 10 years: Yes If all of the above answers are "NO", then may proceed with Cephalosporin use.   . Sulfa Antibiotics Swelling and Anaphylaxis  . Amoxicillin Rash    Patient Measurements: Height: 5\' 6"  (167.6 cm) Weight: 62.1 kg (136 lb 14.5 oz) IBW/kg (Calculated) : 63.8 Heparin Dosing Weight:   Vital Signs: Temp: 98 F (36.7 C) (09/27 0543) Temp Source: Oral (09/27 0543) BP: 154/85 (09/27 0543) Pulse Rate: 57 (09/27 0543)  Labs: Recent Labs    12/05/19 1516 12/06/19 0916 12/06/19 1629 12/07/19 0415 12/07/19 0415 12/07/19 1726 12/08/19 0411  HGB  --  6.3*   < > 7.0*   < > 8.8* 8.7*  HCT  --  19.7*   < > 21.0*  --  26.3* 26.7*  PLT  --  183  --  168  --   --  178  LABPROT 20.3*  --   --   --   --   --   --   INR 1.8*  --   --   --   --   --   --   CREATININE  --  1.10  --  1.29*  --   --  1.17   < > = values in this interval not displayed.    Estimated Creatinine Clearance: 50.1 mL/min (by C-G formula based on SCr of 1.17  mg/dL).   Medications:  Medications Prior to Admission  Medication Sig Dispense Refill Last Dose  . budesonide-formoterol (SYMBICORT) 160-4.5 MCG/ACT inhaler Inhale 2 puffs into the lungs 2 (two) times daily as needed (shortness of breath).    Past Week at Unknown time  . cefpodoxime (VANTIN) 200 MG tablet Take 200 mg by mouth 2 (two) times daily.   12/04/2019 at Unknown time  . cetirizine (ZYRTEC ALLERGY) 10 MG tablet Take 10 mg by mouth daily.   12/02/2019  . gabapentin (NEURONTIN) 300 MG capsule Take 300 mg by mouth 3 (three) times daily.   12/02/2019  . losartan (COZAAR) 100 MG tablet Take 50 mg by mouth daily.    12/02/2019  . pantoprazole (PROTONIX) 40 MG tablet Take 40 mg by mouth daily.   12/02/2019  . Polyethyl Glycol-Propyl Glycol (SYSTANE) 0.4-0.3 % SOLN Place 1 drop into both eyes in the morning and at bedtime.   12/03/2019 at Unknown time  . sertraline (ZOLOFT) 100 MG tablet Take 100 mg by mouth daily.   12/02/2019  . warfarin (COUMADIN) 5 MG tablet Take 5-7.5 mg by mouth See admin instructions. 5mg  daily except 7.5mg  on thursday   12/02/2019 at night  . zolpidem (AMBIEN) 10 MG tablet Take 10  mg by mouth at bedtime.    12/02/2019  . HYDROcodone-acetaminophen (NORCO/VICODIN) 5-325 MG tablet Take 1 tablet by mouth every 6 (six) hours as needed for severe pain. (Patient not taking: Reported on 12/04/2019) 7 tablet 0 Not Taking at Unknown time  . methocarbamol (ROBAXIN) 500 MG tablet Take 1 tablet (500 mg total) by mouth every 8 (eight) hours as needed for muscle spasms. (Patient not taking: Reported on 12/04/2019) 20 tablet 0 Not Taking at Unknown time  . Nebulizers (HEALTHY LIVING COMPRESSOR/NEB) DEVI Use.     . oxyCODONE-acetaminophen (PERCOCET) 5-325 MG tablet Take 1 tablet by mouth every 6 (six) hours as needed. (Patient not taking: Reported on 12/04/2019) 20 tablet 0 Not Taking at Unknown time  . polyethylene glycol (MIRALAX / GLYCOLAX) packet Take 17 g by mouth daily. (Patient not taking:  Reported on 12/04/2019) 14 each 0 Not Taking at Unknown time   Scheduled:  . Chlorhexidine Gluconate Cloth  6 each Topical Daily  . gabapentin  300 mg Oral TID  . insulin aspart  0-9 Units Subcutaneous TID WC  . loratadine  10 mg Oral Daily  . mometasone-formoterol  2 puff Inhalation BID  . pantoprazole  40 mg Oral Daily  . sertraline  100 mg Oral Daily    Assessment: 89 yoM admitted on 9/23 with hematuria, hemorrhagic cystitis, and elevated INR 3.8 (cefpodoxime PTA).  PMH significant for DVT on warfarin, but held on admission d/t bleeding.  He is s/p fulguration on 9/24.  Pharmacy is now consulted to transition to rivaroxaban.  MD reports that he had 3 DVT reoccurences over a period of 40 years.  Whenever he discontinues anticoagulation, he ends up with DVT.  For this reason, Xarelto will be dosed at therapeutic dosing and not the lower indefinite anticoagulation prophylaxis dosing.    INR 1.2 SCr decreased to 1.17 CBC: Hgb improved to 8.7, Plt 178 - Transfused FFP (9/24), PRBC (9/25, 9/26) - Hematuria is improved per urology.  Expect light hematuria s/p fulguration and foley irritation.  Goal of Therapy:  Monitor platelets by anticoagulation protocol: Yes   Plan:  Xarelto 20 mg PO daily with supper. Continue to monitor CBC, bleeding. Pharmacy will provide education prior to discharge.    Gretta Arab PharmD, BCPS Clinical Pharmacist [facility] main pharmacy [phone] 12/08/2019 9:48 AM

## 2019-12-08 NOTE — TOC Benefit Eligibility Note (Signed)
Transition of Care [facility]) Benefit Eligibility Note    Patient Details  Name: Glenn Logan MRN: [id number] Date of Birth: Jan 28, 1946   Medication/Dose: Eliquis 59m bid  Covered?: Yes  Tier: 3 Drug  Prescription Coverage Preferred Pharmacy: local  Spoke with Person/Company/Phone Number:: Amanda/ [employer]/ [phone] Co-Pay: $45.00  Prior Approval: No  Deductible: Met       FKerin SalenPhone Number: 12/08/2019, 10:13 AM

## 2019-12-08 NOTE — Progress Notes (Signed)
3 Days Post-Op Subjective: Patient reports he had a bladder spasm but has not had red urine or clots like last week.   Objective: Vital signs in last 24 hours: Temp:  [98 F (36.7 C)-98.6 F (37 C)] 98 F (36.7 C) (09/27 0543) Pulse Rate:  [57-72] 57 (09/27 0543) Resp:  [16-20] 16 (09/27 0543) BP: (114-160)/(65-85) 154/85 (09/27 0543) SpO2:  [99 %-100 %] 100 % (09/27 0910)  Intake/Output from previous day: 09/26 0701 - 09/27 0700 In: 4102.1 [P.O.:1210; I.V.:2542.1; Blood:350] Out: 2750 [Urine:2750] Intake/Output this shift: No intake/output data recorded.  Physical Exam:  Eating breakfast.  Urine in foley tubing clear - in bag pink to light red   Lab Results: Recent Labs    12/07/19 0415 12/07/19 1726 12/08/19 0411  HGB 7.0* 8.8* 8.7*  HCT 21.0* 26.3* 26.7*   BMET Recent Labs    12/07/19 0415 12/08/19 0411  NA 145 142  K 3.9 3.8  CL 112* 111  CO2 26 25  GLUCOSE 94 86  BUN 11 10  CREATININE 1.29* 1.17  CALCIUM 8.0* 8.1*   Recent Labs    12/05/19 1516  INR 1.8*   No results for input(s): LABURIN in the last 72 hours. Results for orders placed or performed during the hospital encounter of 12/04/19  Respiratory Panel by RT PCR (Flu A&B, Covid) - Nasopharyngeal Swab     Status: None   Collection Time: 12/04/19 11:11 AM   Specimen: Nasopharyngeal Swab  Result Value Ref Range Status   SARS Coronavirus 2 by RT PCR NEGATIVE NEGATIVE Final    Comment: (NOTE) SARS-CoV-2 target nucleic acids are NOT DETECTED.  The SARS-CoV-2 RNA is generally detectable in upper respiratoy specimens during the acute phase of infection. The lowest concentration of SARS-CoV-2 viral copies this assay can detect is 131 copies/mL. A negative result does not preclude SARS-Cov-2 infection and should not be used as the sole basis for treatment or other patient management decisions. A negative result may occur with  improper specimen collection/handling, submission of specimen  other than nasopharyngeal swab, presence of viral mutation(s) within the areas targeted by this assay, and inadequate number of viral copies (<131 copies/mL). A negative result must be combined with clinical observations, patient history, and epidemiological information. The expected result is Negative.  Fact Sheet for Patients:  PinkCheek.be  Fact Sheet for Healthcare Providers:  GravelBags.it  This test is no t yet approved or cleared by the [place] FDA and  has been authorized for detection and/or diagnosis of SARS-CoV-2 by FDA under an Emergency Use Authorization (EUA). This EUA will remain  in effect (meaning this test can be used) for the duration of the COVID-19 declaration under Section 564(b)(1) of the Act, 21 U.S.C. section 360bbb-3(b)(1), unless the authorization is terminated or revoked sooner.     Influenza A by PCR NEGATIVE NEGATIVE Final   Influenza B by PCR NEGATIVE NEGATIVE Final    Comment: (NOTE) The Xpert Xpress SARS-CoV-2/FLU/RSV assay is intended as an aid in  the diagnosis of influenza from Nasopharyngeal swab specimens and  should not be used as a sole basis for treatment. Nasal washings and  aspirates are unacceptable for Xpert Xpress SARS-CoV-2/FLU/RSV  testing.  Fact Sheet for Patients: PinkCheek.be  Fact Sheet for Healthcare Providers: GravelBags.it  This test is not yet approved or cleared by the [place] FDA and  has been authorized for detection and/or diagnosis of SARS-CoV-2 by  FDA under an Emergency Use Authorization (EUA). This EUA will remain  in effect (meaning this test can be used) for the duration of the  Covid-19 declaration under Section 564(b)(1) of the Act, 21  U.S.C. section 360bbb-3(b)(1), unless the authorization is  terminated or revoked. Performed at [facility], [place] [address], [place], [place] [address]     Studies/Results: No results found.  Assessment/Plan:  Hemorrhagic cystitis - resolved. Expect some light hematuria p fulguration. Foley might irritate. Will d/c. I don't believe he has significant bleeding. His Hgb has responded appropriately to the transfusions and is stable.   He can f/u with me as outpt. I told him to call [facility] for a PCP referral.   Discussed with Dr. Karleen Hampshire and appreciate her excellent care.    LOS: 4 days   Festus Aloe 12/08/2019, 10:09 AM

## 2019-12-08 NOTE — TOC Benefit Eligibility Note (Signed)
Transition of Care [facility]) Benefit Eligibility Note    Patient Details  Name: Glenn Logan MRN: [id number] Date of Birth: September 19, 1945   Medication/Dose: Alveda Reasons  Covered?: Yes  Tier: 3 Drug  Prescription Coverage Preferred Pharmacy: local  Spoke with Person/Company/Phone Number:: Vivi Ferns [employer] [phone]  Co-Pay: $45.00  Prior Approval: No  Deductible: Met       Kerin Salen Phone Number: 12/08/2019, 9:53 AM

## 2019-12-08 NOTE — Care Management Important Message (Signed)
Important Message  Patient Details IM Letter given to the Patient Name: Glenn Logan MRN: [id number] Date of Birth: 08-14-1945   Medicare Important Message Given:  Yes     Kerin Salen 12/08/2019, 12:40 PM

## 2019-12-09 LAB — GLUCOSE, CAPILLARY: Glucose-Capillary: 90 mg/dL (ref 70–99)

## 2019-12-09 MED ORDER — RIVAROXABAN 20 MG PO TABS: 20.0000 mg | ORAL_TABLET | Freq: Every day | ORAL | 3 refills | Status: AC

## 2019-12-09 NOTE — Progress Notes (Signed)
Discharge teaching done with patient.  Patient declined to any morning meds.  States he will take them at home.

## 2019-12-09 NOTE — Discharge Instructions (Signed)
Cystoscopy Cystoscopy is a procedure that is used to help diagnose and sometimes treat conditions that affect the lower urinary tract. The lower urinary tract includes the bladder and the urethra. The urethra is the tube that drains urine from the bladder. Cystoscopy is done using a thin, tube-shaped instrument with a light and camera at the end (cystoscope). The cystoscope may be hard or flexible, depending on the goal of the procedure. The cystoscope is inserted through the urethra, into the bladder. Cystoscopy may be recommended if you have:  Urinary tract infections that keep coming back.  Blood in the urine (hematuria).  An inability to control when you urinate (urinary incontinence) or an overactive bladder.  Unusual cells found in a urine sample.  A blockage in the urethra, such as a urinary stone.  Painful urination.  An abnormality in the bladder found during an intravenous pyelogram (IVP) or CT scan. Cystoscopy may also be done to remove a sample of tissue to be examined under a microscope (biopsy). Tell a health care provider about:  Any allergies you have.  All medicines you are taking, including vitamins, herbs, eye drops, creams, and over-the-counter medicines.  Any problems you or family members have had with anesthetic medicines.  Any blood disorders you have.  Any surgeries you have had.  Any medical conditions you have.  Whether you are pregnant or may be pregnant. What are the risks? Generally, this is a safe procedure. However, problems may occur, including:  Infection.  Bleeding.  Allergic reactions to medicines.  Damage to other structures or organs. What happens before the procedure?  Ask your health care provider about: ? Changing or stopping your regular medicines. This is especially important if you are taking diabetes medicines or blood thinners. ? Taking medicines such as aspirin and ibuprofen. These medicines can thin your blood. Do not take  these medicines unless your health care provider tells you to take them. ? Taking over-the-counter medicines, vitamins, herbs, and supplements.  Follow instructions from your health care provider about eating or drinking restrictions.  Ask your health care provider what steps will be taken to help prevent infection. These may include: ? Washing skin with a germ-killing soap. ? Taking antibiotic medicine.  You may have an exam or testing, such as: ? X-rays of the bladder, urethra, or kidneys. ? Urine tests to check for signs of infection.  Plan to have someone take you home from the hospital or clinic. What happens during the procedure?   You will be given one or more of the following: ? A medicine to help you relax (sedative). ? A medicine to numb the area (local anesthetic).  The area around the opening of your urethra will be cleaned.  The cystoscope will be passed through your urethra into your bladder.  Germ-free (sterile) fluid will flow through the cystoscope to fill your bladder. The fluid will stretch your bladder so that your health care provider can clearly examine your bladder walls.  Your doctor will look at the urethra and bladder. Your doctor may take a biopsy or remove stones.  The cystoscope will be removed, and your bladder will be emptied. The procedure may vary among health care providers and hospitals. What can I expect after the procedure? After the procedure, it is common to have:  Some soreness or pain in your abdomen and urethra.  Urinary symptoms. These include: ? Mild pain or burning when you urinate. Pain should stop within a few minutes after you urinate. This   may last for up to 1 week. ? A small amount of blood in your urine for several days. ? Feeling like you need to urinate but producing only a small amount of urine. Follow these instructions at home: Medicines  Take over-the-counter and prescription medicines only as told by your health care  provider.  If you were prescribed an antibiotic medicine, take it as told by your health care provider. Do not stop taking the antibiotic even if you start to feel better. General instructions  Return to your normal activities as told by your health care provider. Ask your health care provider what activities are safe for you.  Do not drive for 24 hours if you were given a sedative during your procedure.  Watch for any blood in your urine. If the amount of blood in your urine increases, call your health care provider.  Follow instructions from your health care provider about eating or drinking restrictions.  If a tissue sample was removed for testing (biopsy) during your procedure, it is up to you to get your test results. Ask your health care provider, or the department that is doing the test, when your results will be ready.  Drink enough fluid to keep your urine pale yellow.  Keep all follow-up visits as told by your health care provider. This is important. Contact a health care provider if you:  Have pain that gets worse or does not get better with medicine, especially pain when you urinate.  Have trouble urinating.  Have more blood in your urine. Get help right away if you:  Have blood clots in your urine.  Have abdominal pain.  Have a fever or chills.  Are unable to urinate. Summary  Cystoscopy is a procedure that is used to help diagnose and sometimes treat conditions that affect the lower urinary tract.  Cystoscopy is done using a thin, tube-shaped instrument with a light and camera at the end.  After the procedure, it is common to have some soreness or pain in your abdomen and urethra.  Watch for any blood in your urine. If the amount of blood in your urine increases, call your health care provider.  If you were prescribed an antibiotic medicine, take it as told by your health care provider. Do not stop taking the antibiotic even if you start to feel better. This  information is not intended to replace advice given to you by your health care provider. Make sure you discuss any questions you have with your health care provider. Document Revised: 02/19/2018 Document Reviewed: 02/19/2018 Elsevier Patient Education  2020 [place] on my medicine - XARELTO (rivaroxaban)  [place]? Xarelto was prescribed to treat blood clots that may have been found in the veins of your legs (deep vein thrombosis) or in your lungs (pulmonary embolism) and to reduce the risk of them occurring again.  What do you need to know about Xarelto? The dose is one 20 mg tablet taken ONCE A DAY with your evening meal.  DO NOT stop taking Xarelto without talking to the health care provider who prescribed the medication.  Refill your prescription for 20 mg tablets before you run out.  After discharge, you should have regular check-up appointments with your healthcare provider that is prescribing your Xarelto.  In the future your dose may need to be changed if your kidney function changes by a significant amount.  What do you do if you miss a dose? If you are  taking Xarelto ONCE DAILY and you miss a dose, take it as soon as you remember on the same day then continue your regularly scheduled once daily regimen the next day. Do not take two doses of Xarelto at the same time.   Important Safety Information Xarelto is a blood thinner medicine that can cause bleeding. You should call your healthcare provider right away if you experience any of the following: ? Bleeding from an injury or your nose that does not stop. ? Unusual colored urine (red or dark brown) or unusual colored stools (red or black). ? Unusual bruising for unknown reasons. ? A serious fall or if you hit your head (even if there is no bleeding).  Some medicines may interact with Xarelto and might increase your risk of bleeding while on Xarelto. To help avoid this, consult  your healthcare provider or pharmacist prior to using any new prescription or non-prescription medications, including herbals, vitamins, non-steroidal anti-inflammatory drugs (NSAIDs) and supplements.  This website has more information on Xarelto: [email or web address]

## 2019-12-09 NOTE — Plan of Care (Signed)
  Problem: Education: Goal: Knowledge of General Education information will improve Description: Including pain rating scale, medication(s)/side effects and non-pharmacologic comfort measures Outcome: Adequate for Discharge   Problem: Health Behavior/Discharge Planning: Goal: Ability to manage health-related needs will improve Outcome: Adequate for Discharge   Problem: Clinical Measurements: Goal: Ability to maintain clinical measurements within normal limits will improve Outcome: Adequate for Discharge Goal: Will remain free from infection Outcome: Adequate for Discharge Goal: Diagnostic test results will improve Outcome: Adequate for Discharge Goal: Respiratory complications will improve Outcome: Adequate for Discharge Goal: Cardiovascular complication will be avoided Outcome: Adequate for Discharge   Problem: Activity: Goal: Risk for activity intolerance will decrease Outcome: Adequate for Discharge   Problem: Nutrition: Goal: Adequate nutrition will be maintained Outcome: Adequate for Discharge   Problem: Coping: Goal: Level of anxiety will decrease Outcome: Adequate for Discharge   Problem: Elimination: Goal: Will not experience complications related to bowel motility Outcome: Adequate for Discharge Goal: Will not experience complications related to urinary retention Outcome: Adequate for Discharge   Problem: Pain Managment: Goal: General experience of comfort will improve Outcome: Adequate for Discharge   Problem: Safety: Goal: Ability to remain free from injury will improve Outcome: Adequate for Discharge   Problem: Skin Integrity: Goal: Risk for impaired skin integrity will decrease Outcome: Adequate for Discharge    Discharge teaching done.  Written information given.

## 2019-12-09 NOTE — TOC Transition Note (Addendum)
Transition of Care [facility]) - CM/SW Discharge Note   Patient Details  Name: Glenn Logan MRN: [id number] Date of Birth: 1946/02/16  Transition of [facility]) CM/SW Contact:  Lia Hopping, [place] Phone Number: 12/09/2019, 12:00 PM   Clinical Narrative:    CSW reached out to [facility] to determine if the Xarelto Script was approved by [facility] physician.  CSW was informed the fax was received. CSW was informed the Primary Clinic nurse will reach out to the patient about his Xarelto Prescription.   CSW sent the patient with a hard script for Xarelto and informed him to go to the [facility] today if he did not hear anything from his PCP office.   Patient report understanding.    Final next level of care: Home/Self Care Barriers to Discharge: Continued Medical Work up   Patient Goals and CMS Choice     Choice offered to / list presented to : NA  Discharge Placement                       Discharge Plan and Services In-house Referral: Clinical Social Work Discharge Planning Services: NA Post Acute Care Choice: NA          DME Arranged: N/A DME Agency: NA       HH Arranged: NA HH Agency: NA        Social Determinants of Health (SDOH) Interventions     Readmission Risk Interventions No flowsheet data found.

## 2019-12-16 NOTE — Discharge Summary (Signed)
Physician Discharge Summary  Glenn Logan YTK:[id number] DOB: 12-29-1945 DOA: 12/04/2019  PCP: [facility]  Admit date: 12/04/2019 Discharge date: 12/09/2019  Admitted From: Home.  Disposition:  Home.  Recommendations for Outpatient Follow-up:  1. Follow up with PCP in 1-2 weeks 2. Please obtain BMP/CBC in one week 3. Please follow up with urology in 1 to 2 weeks.     Discharge Condition: stable.  CODE STATUS:FULL CODE Diet recommendation: Heart Healthy   Brief/Interim Summary: 74 year old gentleman prior history of prostate cancer, diabetes mellitus, history of DVT on Coumadin hypertension presents to ED for hematuria. On arrival to ED he was found to have supratherapeutic INR.  Coumadin was held, but patient continued to have active bleeding with clots and INR remained elevated at 3.9. Urology consulted recommended cystoscopy with CBI and required INR to be less than 2. 2 units of FFP were ordered after discussing with the patient, for reversal of Coumadin for cystoscopy .  Patient underwent cystoscopy and fulguration of the bleeding vessels in the left side of the bladder and left bladder neck by Dr. Junious Silk on 12/05/2019.  His bleeding has stopped for approximately 24 to 36 hours.  Started having hematuria this morning and his hemoglobin has dropped to 7 again.  1 unit of PRBC transfusion ordered.   Patient seen and examined at bedside. His hematuria appears to have completely resolved.  Foley has been removed.  Patient would like to be transition to newer anticoagulants.  Xarelto was started and discharged.   Discharge Diagnoses:  Active Problems:   Hematuria  Gross hematuria possibly secondary to hemorrhagic cystitis Not improving with CBI trial.  Underwent cystoscopy and fulguration of bleeding vessels in the left side of the bladder and left bladder neck by Dr. Junious Silk on 12/05/2019 Appreciate Dr. Junious Silk assistance His hematuria appears to have completely  resolved.  Foley has been removed.  Patient would like to be transition to newer anticoagulants.  Xarelto was started on discharge. Repeat hemoglobin stable.     Hypotension, asymptomatic Pressure parameters are optimal this morning   Type 2 diabetes mellitus Resume home meds on discharge   GERD  Stable.   H/o DVT on coumadin.  Patient would like to initiate NOAC's He was started on Xarelto.   Acute anemia of blood loss possibly from hematuria. Baseline hemoglobin around 10 dropped to 6.3 today possibly from hematuria. S/p 1 unit of PRBC transfusion, repeat hemoglobin stable around 10.     Discharge Instructions  Discharge Instructions    Diet - low sodium heart healthy   Complete by: As directed    Discharge instructions   Complete by: As directed    Please follow up with Urology and PCP as recommended.     Allergies as of 12/09/2019      Reactions   Penicillins Anaphylaxis, Rash   Has patient had a PCN reaction causing immediate rash, facial/tongue/throat swelling, SOB or lightheadedness with hypotension: Yes Has patient had a PCN reaction causing severe rash involving mucus membranes or skin necrosis: Yes Has patient had a PCN reaction that required hospitalization No Has patient had a PCN reaction occurring within the last 10 years: Yes If all of the above answers are "NO", then may proceed with Cephalosporin use. Has patient had a PCN reaction causing immediate rash, facial/tongue/throat swelling, SOB or lightheadedness with hypotension: Yes Has patient had a PCN reaction causing severe rash involving mucus membranes or skin necrosis: Yes Has patient had a PCN reaction that required hospitalization  No Has patient had a PCN reaction occurring within the last 10 years: Yes If all of the above answers are "NO", then may proceed with Cephalosporin use.   Sulfa Antibiotics Swelling, Anaphylaxis   Amoxicillin Rash      Medication List    STOP taking  these medications   cefpodoxime 200 MG tablet Commonly known as: VANTIN   HYDROcodone-acetaminophen 5-325 MG tablet Commonly known as: NORCO/VICODIN   methocarbamol 500 MG tablet Commonly known as: ROBAXIN   oxyCODONE-acetaminophen 5-325 MG tablet Commonly known as: Percocet   polyethylene glycol 17 g packet Commonly known as: MIRALAX / GLYCOLAX   warfarin 5 MG tablet Commonly known as: COUMADIN     TAKE these medications   budesonide-formoterol 160-4.5 MCG/ACT inhaler Commonly known as: SYMBICORT Inhale 2 puffs into the lungs 2 (two) times daily as needed (shortness of breath).   gabapentin 300 MG capsule Commonly known as: NEURONTIN Take 300 mg by mouth 3 (three) times daily.   Healthy Living Compressor/Neb Devi Use.   losartan 100 MG tablet Commonly known as: COZAAR Take 50 mg by mouth daily.   pantoprazole 40 MG tablet Commonly known as: PROTONIX Take 40 mg by mouth daily.   rivaroxaban 20 MG Tabs tablet Commonly known as: XARELTO Take 1 tablet (20 mg total) by mouth daily with supper.   sertraline 100 MG tablet Commonly known as: ZOLOFT Take 100 mg by mouth daily.   Systane 0.4-0.3 % Soln Generic drug: Polyethyl Glycol-Propyl Glycol Place 1 drop into both eyes in the morning and at bedtime.   zolpidem 10 MG tablet Commonly known as: AMBIEN Take 10 mg by mouth at bedtime.   ZyrTEC Allergy 10 MG tablet Generic drug: cetirizine Take 10 mg by mouth daily.       Follow-up Information    Call Festus Aloe, MD.   Specialty: Urology Contact information: [address] [address] [place]. Schedule an appointment as soon as possible for a visit in 1 week(s).   Specialty: General Practice Contact information: [place] [address] [phone]              Allergies  Allergen Reactions  . Penicillins Anaphylaxis and Rash    Has patient had a PCN reaction causing  immediate rash, facial/tongue/throat swelling, SOB or lightheadedness with hypotension: Yes Has patient had a PCN reaction causing severe rash involving mucus membranes or skin necrosis: Yes Has patient had a PCN reaction that required hospitalization No Has patient had a PCN reaction occurring within the last 10 years: Yes If all of the above answers are "NO", then may proceed with Cephalosporin use. Has patient had a PCN reaction causing immediate rash, facial/tongue/throat swelling, SOB or lightheadedness with hypotension: Yes Has patient had a PCN reaction causing severe rash involving mucus membranes or skin necrosis: Yes Has patient had a PCN reaction that required hospitalization No Has patient had a PCN reaction occurring within the last 10 years: Yes If all of the above answers are "NO", then may proceed with Cephalosporin use.   . Sulfa Antibiotics Swelling and Anaphylaxis  . Amoxicillin Rash    Consultations:  None.    Procedures/Studies: CT ABDOMEN PELVIS W CONTRAST  Result Date: 12/04/2019 CLINICAL DATA:  Suprapubic abdominal pain.  Gross hematuria EXAM: CT ABDOMEN AND PELVIS WITH CONTRAST TECHNIQUE: Multidetector CT imaging of the abdomen and pelvis was performed using the standard protocol following bolus administration of intravenous  contrast. CONTRAST:  77mL OMNIPAQUE IOHEXOL 300 MG/ML  SOLN COMPARISON:  01/06/2015 FINDINGS: Lower chest: Lung bases are clear. No effusions. Heart is normal size. Hepatobiliary: Scattered hypodensities in the liver are stable since prior study, likely cysts. Gallbladder unremarkable. Pancreas: No focal abnormality or ductal dilatation. Spleen: No focal abnormality.  Normal size. Adrenals/Urinary Tract: Small cyst in the upper pole of the right kidney, stable. No stones or hydronephrosis. Adrenal glands are unremarkable. Foley catheter is present within the bladder. Gas and soft tissue density material noted within the bladder, likely blood clot.  Stomach/Bowel: Sigmoid diverticulosis. No active diverticulitis. Normal appendix. Stomach and small bowel decompressed, unremarkable. Vascular/Lymphatic: No evidence of aneurysm or adenopathy. Reproductive: No visible focal abnormality.  Prior prostatectomy. Other: No free fluid or free air. Musculoskeletal: No acute bony abnormality. IMPRESSION: No renal or ureteral stones.  No hydronephrosis. Soft tissue density within the bladder likely reflects blood clots. Foley catheter in place. Prior prostatectomy. Sigmoid diverticulosis.  No active diverticulitis. Electronically Signed   By: Rolm Baptise [name]   On: 12/04/2019 10:51       Subjective: No new complaints.   Discharge Exam: Vitals:   12/09/19 0539 12/09/19 0919  BP: (!) 149/88   Pulse: 62   Resp: 17   Temp: 97.8 F (36.6 C)   SpO2: 99% 98%   Vitals:   12/08/19 1402 12/08/19 2249 12/09/19 0539 12/09/19 0919  BP: (!) 147/80 (!) 173/83 (!) 149/88   Pulse: 60 61 62   Resp: 18 18 17    Temp: 98.1 F (36.7 C) 98.1 F (36.7 C) 97.8 F (36.6 C)   TempSrc: Oral Oral Oral   SpO2: 100% 100% 99% 98%  Weight:      Height:        General: Pt is alert, awake, not in acute distress Cardiovascular: RRR, S1/S2 +, no rubs, no gallops Respiratory: CTA bilaterally, no wheezing, no rhonchi Abdominal: Soft, NT, ND, bowel sounds + Extremities: no edema, no cyanosis    The results of significant diagnostics from this hospitalization (including imaging, microbiology, ancillary and laboratory) are listed below for reference.     Microbiology: No results found for this or any previous visit (from the past 240 hour(s)).   Labs: BNP (last 3 results) No results for input(s): BNP in the last 8760 hours. Basic Metabolic Panel: No results for input(s): NA, K, CL, CO2, GLUCOSE, BUN, CREATININE, CALCIUM, MG, PHOS in the last 168 hours. Liver Function Tests: No results for input(s): AST, ALT, ALKPHOS, BILITOT, PROT, ALBUMIN in the last 168  hours. No results for input(s): LIPASE, AMYLASE in the last 168 hours. No results for input(s): AMMONIA in the last 168 hours. CBC: No results for input(s): WBC, NEUTROABS, HGB, HCT, MCV, PLT in the last 168 hours. Cardiac Enzymes: No results for input(s): CKTOTAL, CKMB, CKMBINDEX, TROPONINI in the last 168 hours. BNP: Invalid input(s): POCBNP CBG: No results for input(s): GLUCAP in the last 168 hours. D-Dimer No results for input(s): DDIMER in the last 72 hours. Hgb A1c No results for input(s): HGBA1C in the last 72 hours. Lipid Profile No results for input(s): CHOL, HDL, LDLCALC, TRIG, CHOLHDL, LDLDIRECT in the last 72 hours. Thyroid function studies No results for input(s): TSH, T4TOTAL, T3FREE, THYROIDAB in the last 72 hours.  Invalid input(s): FREET3 Anemia work up No results for input(s): VITAMINB12, FOLATE, FERRITIN, TIBC, IRON, RETICCTPCT in the last 72 hours. Urinalysis    Component Value Date/Time   COLORURINE STRAW (A) 12/08/2019 1305  APPEARANCEUR HAZY (A) 12/08/2019 1305   LABSPEC 1.005 12/08/2019 1305   PHURINE 8.0 12/08/2019 1305   GLUCOSEU NEGATIVE 12/08/2019 1305   HGBUR LARGE (A) 12/08/2019 1305   BILIRUBINUR NEGATIVE 12/08/2019 1305   KETONESUR NEGATIVE 12/08/2019 1305   PROTEINUR 30 (A) 12/08/2019 1305   NITRITE NEGATIVE 12/08/2019 1305   LEUKOCYTESUR TRACE (A) 12/08/2019 1305   Sepsis Labs Invalid input(s): PROCALCITONIN,  WBC,  LACTICIDVEN Microbiology No results found for this or any previous visit (from the past 240 hour(s)).   Time coordinating discharge: 32 minutes.  SIGNED:   Hosie Poisson, MD  [facility]

## 2020-01-06 DIAGNOSIS — N401 Enlarged prostate with lower urinary tract symptoms: Secondary | ICD-10-CM | POA: Diagnosis not present

## 2020-01-06 DIAGNOSIS — N393 Stress incontinence (female) (male): Secondary | ICD-10-CM | POA: Diagnosis not present

## 2020-01-06 DIAGNOSIS — N3091 Cystitis, unspecified with hematuria: Secondary | ICD-10-CM | POA: Diagnosis not present

## 2020-01-06 DIAGNOSIS — Z8546 Personal history of malignant neoplasm of prostate: Secondary | ICD-10-CM | POA: Diagnosis not present

## 2020-01-13 DIAGNOSIS — R3912 Poor urinary stream: Secondary | ICD-10-CM | POA: Diagnosis not present

## 2020-01-13 DIAGNOSIS — M62838 Other muscle spasm: Secondary | ICD-10-CM | POA: Diagnosis not present

## 2020-01-13 DIAGNOSIS — M6281 Muscle weakness (generalized): Secondary | ICD-10-CM | POA: Diagnosis not present

## 2020-01-13 DIAGNOSIS — M6289 Other specified disorders of muscle: Secondary | ICD-10-CM | POA: Diagnosis not present

## 2020-01-13 DIAGNOSIS — N3942 Incontinence without sensory awareness: Secondary | ICD-10-CM | POA: Diagnosis not present

## 2020-01-13 DIAGNOSIS — R3915 Urgency of urination: Secondary | ICD-10-CM | POA: Diagnosis not present

## 2020-01-13 DIAGNOSIS — R350 Frequency of micturition: Secondary | ICD-10-CM | POA: Diagnosis not present

## 2021-01-20 DIAGNOSIS — R311 Benign essential microscopic hematuria: Secondary | ICD-10-CM | POA: Diagnosis not present

## 2021-01-20 DIAGNOSIS — N3091 Cystitis, unspecified with hematuria: Secondary | ICD-10-CM | POA: Diagnosis not present

## 2021-03-23 DIAGNOSIS — N3091 Cystitis, unspecified with hematuria: Secondary | ICD-10-CM | POA: Diagnosis not present

## 2021-03-30 ENCOUNTER — Telehealth: Payer: Self-pay | Admitting: Hematology and Oncology

## 2021-03-30 NOTE — Telephone Encounter (Signed)
Scheduled appt per 1/17 referral. Pt is aware of appt date and time. Pt is aware to arrive 15 mins prior to appt time.

## 2021-04-15 ENCOUNTER — Encounter: Payer: Self-pay | Admitting: Hematology and Oncology

## 2021-04-15 ENCOUNTER — Other Ambulatory Visit: Payer: Self-pay

## 2021-04-15 ENCOUNTER — Inpatient Hospital Stay: Payer: Medicare Other | Attending: Hematology and Oncology | Admitting: Hematology and Oncology

## 2021-04-15 DIAGNOSIS — E119 Type 2 diabetes mellitus without complications: Secondary | ICD-10-CM | POA: Insufficient documentation

## 2021-04-15 DIAGNOSIS — Z86718 Personal history of other venous thrombosis and embolism: Secondary | ICD-10-CM | POA: Diagnosis not present

## 2021-04-15 DIAGNOSIS — Z8546 Personal history of malignant neoplasm of prostate: Secondary | ICD-10-CM | POA: Insufficient documentation

## 2021-04-15 DIAGNOSIS — I10 Essential (primary) hypertension: Secondary | ICD-10-CM | POA: Diagnosis not present

## 2021-04-16 ENCOUNTER — Encounter: Payer: Self-pay | Admitting: Hematology and Oncology
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
## 2021-04-16 NOTE — Assessment & Plan Note (Signed)
He continues close monitoring at urology clinic and his last PSA from end of last year was undetectable He has no symptoms to suggest cancer recurrence He will continue long-term follow-up at the urology clinic

## 2021-07-20 IMAGING — CT CT ABD-PELV W/ CM
3 of 5 series · 16 of 46 positions shown, 18 images · IV contrast (omnipaque)
Comparison: 01/06/2015

CLINICAL DATA: Suprapubic abdominal pain.  Gross hematuria

EXAM:
CT ABDOMEN AND PELVIS WITH CONTRAST
TECHNIQUE: Multidetector CT imaging of the abdomen and pelvis was performed
using the standard protocol following bolus administration of
intravenous contrast.
CONTRAST:  80mL OMNIPAQUE IOHEXOL 300 MG/ML  SOLN

[Series 2: axial st · axial · 0.67mm/px · z∈[+1109,+1494]mm · 11 of 93 slices shown, 13 images]
[im 8/93  soft-tissue]
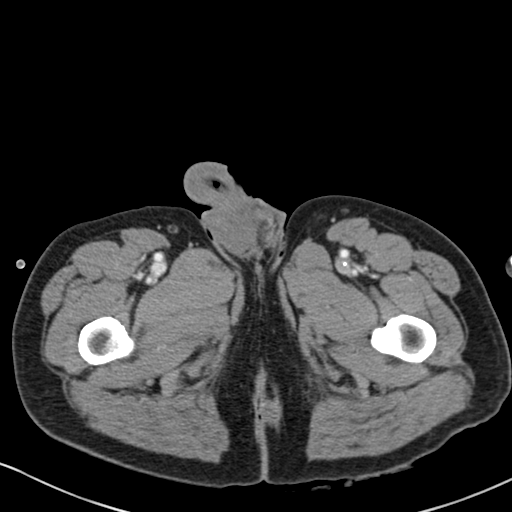
[im 8/93  bone]
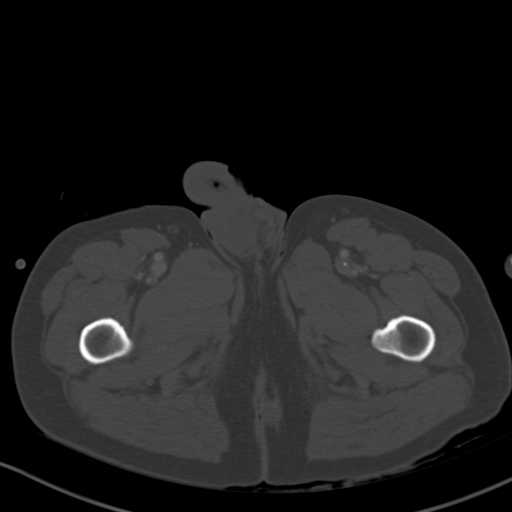
[im 16/93  soft-tissue]
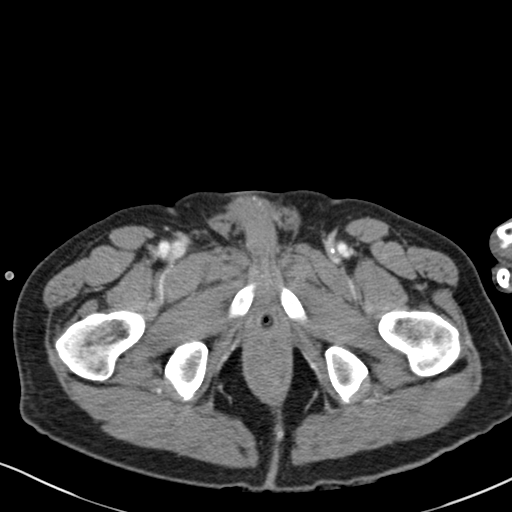
[im 24/93  soft-tissue]
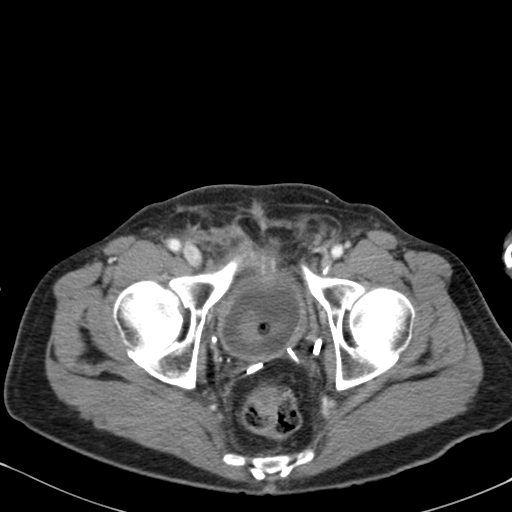
[im 31/93  soft-tissue]
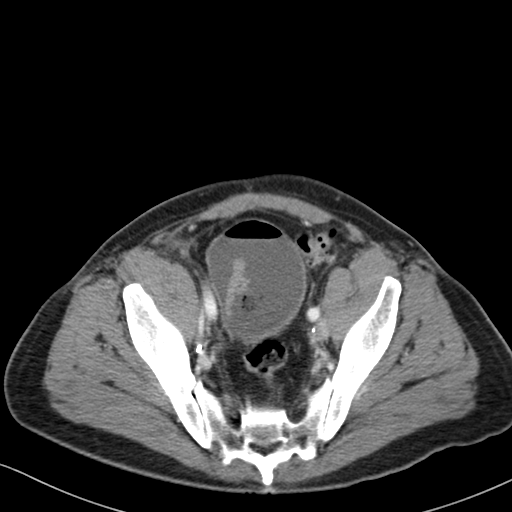
[im 39/93  soft-tissue]
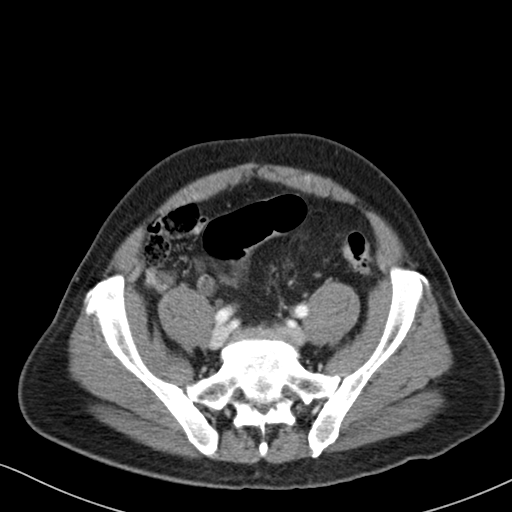
[im 47/93  soft-tissue]
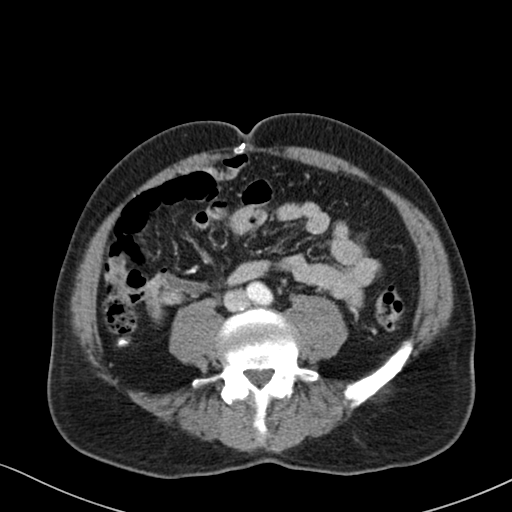
[im 54/93  soft-tissue]
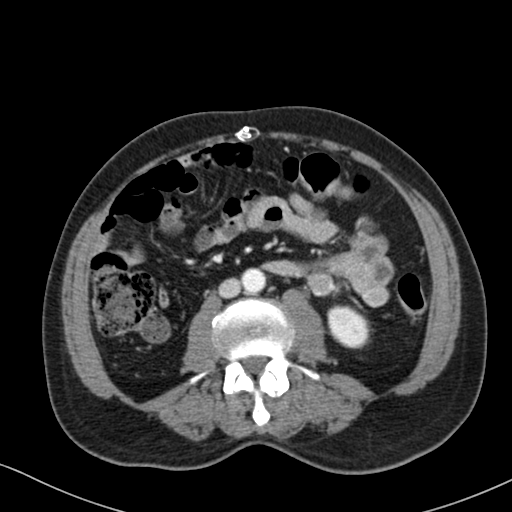
[im 62/93  soft-tissue]
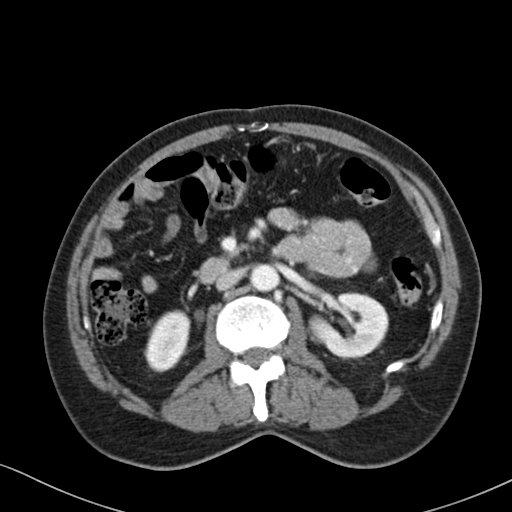
[im 70/93  soft-tissue]
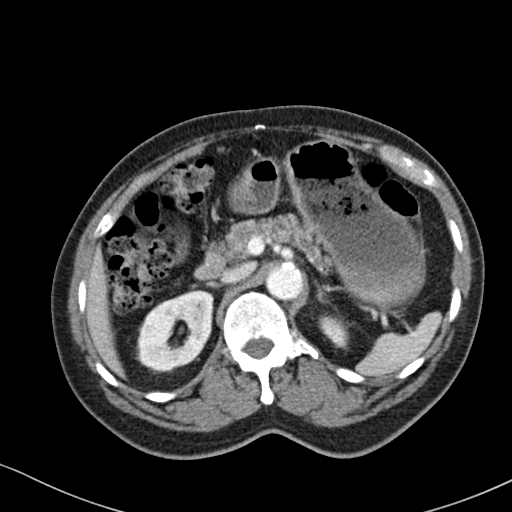
[im 70/93  bone]
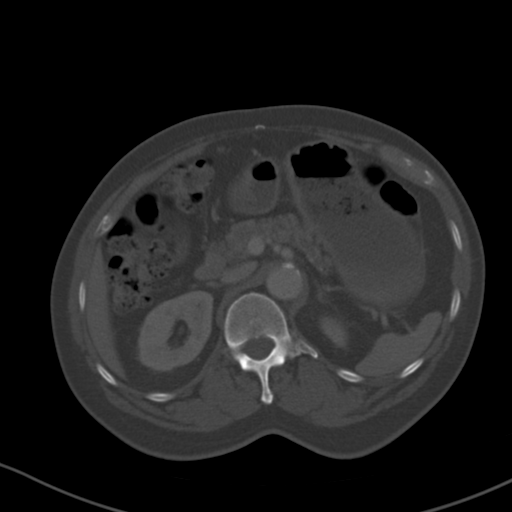
[im 77/93  soft-tissue]
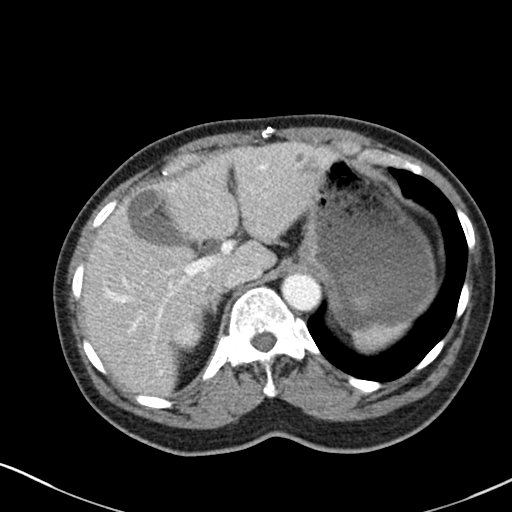
[im 85/93  soft-tissue]
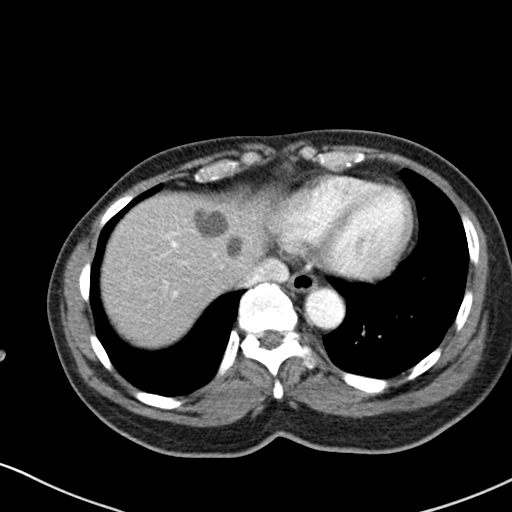

[Series 3: lung bases · axial · 0.62mm/px · z∈[+1406,+1422]mm · 2 of 72 slices shown]
[im 8/72  bone]
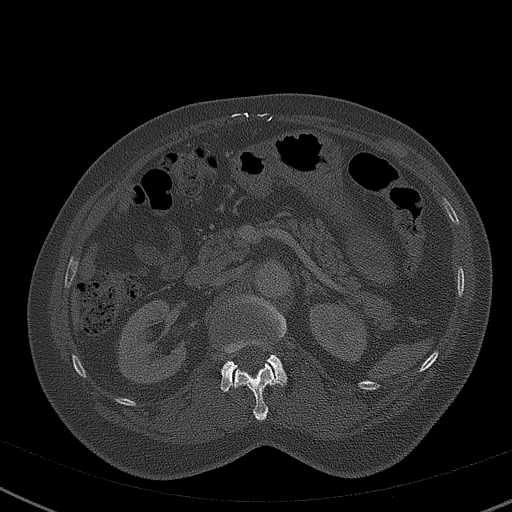
[im 16/72  bone]
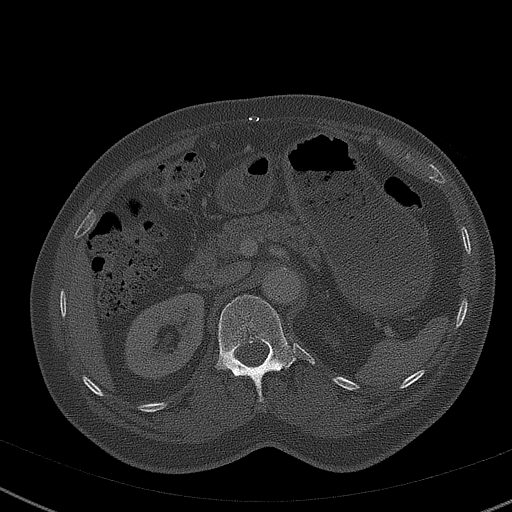

[Series 5: coronal st · coronal · 0.63mm/px · 3 of 126 slices shown]
[im 42/126  soft-tissue]
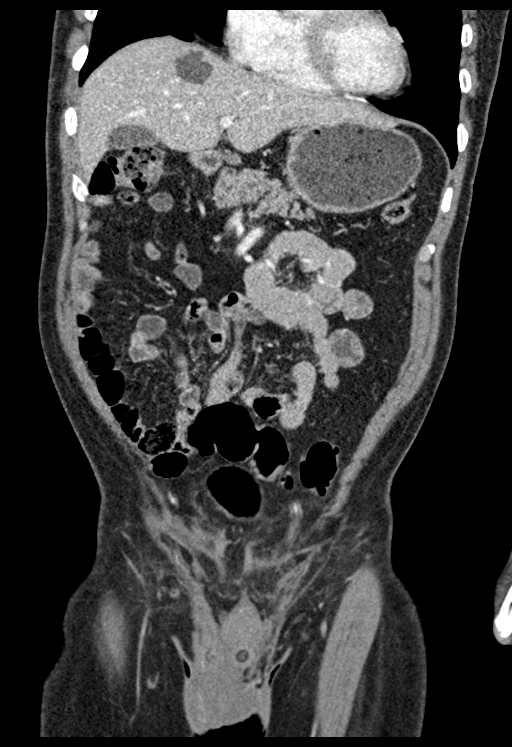
[im 56/126  soft-tissue]
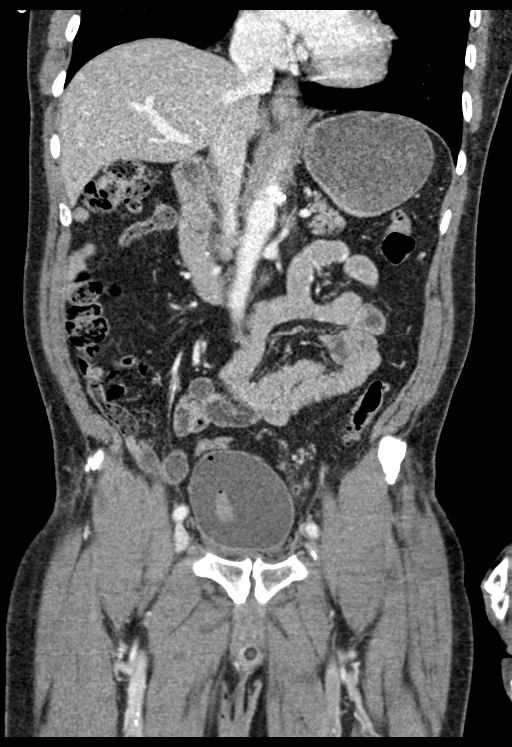
[im 70/126  soft-tissue]
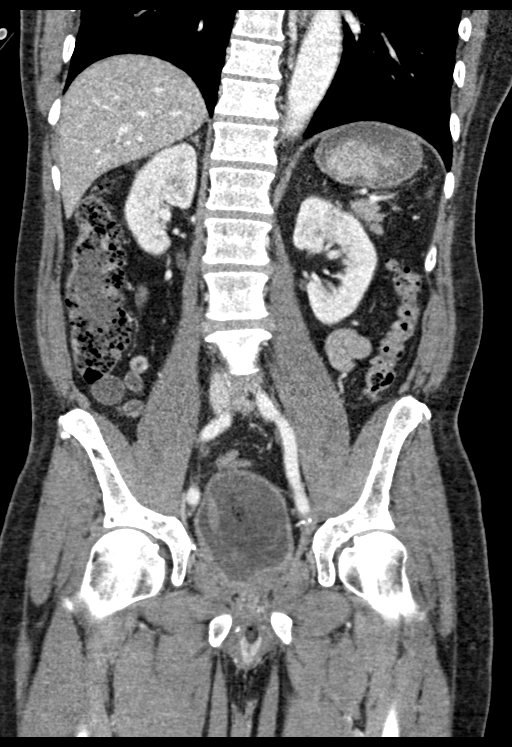

[16 of 46 positions shown; findings below may reference images not displayed]

FINDINGS: Lower chest: Lung bases are clear. No effusions. Heart is normal
size.

Hepatobiliary: Scattered hypodensities in the liver are stable since
prior study, likely cysts. Gallbladder unremarkable.

Pancreas: No focal abnormality or ductal dilatation.

Spleen: No focal abnormality.  Normal size.

Adrenals/Urinary Tract: Small cyst in the upper pole of the right
kidney, stable. No stones or hydronephrosis. Adrenal glands are
unremarkable. Foley catheter is present within the bladder. Gas and
soft tissue density material noted within the bladder, likely blood
clot.

Stomach/Bowel: Sigmoid diverticulosis. No active diverticulitis.
Normal appendix. Stomach and small bowel decompressed, unremarkable.

Vascular/Lymphatic: No evidence of aneurysm or adenopathy.

Reproductive: No visible focal abnormality.  Prior prostatectomy.

Other: No free fluid or free air.

Musculoskeletal: No acute bony abnormality.
IMPRESSION: No renal or ureteral stones.  No hydronephrosis.

Soft tissue density within the bladder likely reflects blood clots.
Foley catheter in place.

Prior prostatectomy.

Sigmoid diverticulosis.  No active diverticulitis.

## 2021-11-25 DIAGNOSIS — J449 Chronic obstructive pulmonary disease, unspecified: Secondary | ICD-10-CM | POA: Diagnosis not present

## 2021-11-25 DIAGNOSIS — E1140 Type 2 diabetes mellitus with diabetic neuropathy, unspecified: Secondary | ICD-10-CM | POA: Diagnosis not present

## 2021-11-25 DIAGNOSIS — M6530 Trigger finger, unspecified finger: Secondary | ICD-10-CM | POA: Diagnosis not present

## 2021-11-25 DIAGNOSIS — Z79899 Other long term (current) drug therapy: Secondary | ICD-10-CM | POA: Diagnosis not present

## 2021-11-25 DIAGNOSIS — I10 Essential (primary) hypertension: Secondary | ICD-10-CM | POA: Diagnosis not present

## 2021-11-25 DIAGNOSIS — Z23 Encounter for immunization: Secondary | ICD-10-CM | POA: Diagnosis not present

## 2021-11-25 DIAGNOSIS — K5900 Constipation, unspecified: Secondary | ICD-10-CM | POA: Diagnosis not present

## 2021-11-25 DIAGNOSIS — I7090 Unspecified atherosclerosis: Secondary | ICD-10-CM | POA: Diagnosis not present

## 2021-11-25 DIAGNOSIS — E559 Vitamin D deficiency, unspecified: Secondary | ICD-10-CM | POA: Diagnosis not present

## 2021-11-29 DIAGNOSIS — M654 Radial styloid tenosynovitis [de Quervain]: Secondary | ICD-10-CM | POA: Diagnosis not present

## 2021-11-29 DIAGNOSIS — Z79899 Other long term (current) drug therapy: Secondary | ICD-10-CM | POA: Diagnosis not present

## 2021-11-29 DIAGNOSIS — I10 Essential (primary) hypertension: Secondary | ICD-10-CM | POA: Diagnosis not present

## 2021-11-29 DIAGNOSIS — E559 Vitamin D deficiency, unspecified: Secondary | ICD-10-CM | POA: Diagnosis not present

## 2021-12-07 DIAGNOSIS — Z0001 Encounter for general adult medical examination with abnormal findings: Secondary | ICD-10-CM | POA: Diagnosis not present

## 2021-12-07 DIAGNOSIS — M654 Radial styloid tenosynovitis [de Quervain]: Secondary | ICD-10-CM | POA: Diagnosis not present

## 2021-12-07 DIAGNOSIS — E1140 Type 2 diabetes mellitus with diabetic neuropathy, unspecified: Secondary | ICD-10-CM | POA: Diagnosis not present

## 2021-12-07 DIAGNOSIS — D72819 Decreased white blood cell count, unspecified: Secondary | ICD-10-CM | POA: Diagnosis not present

## 2021-12-07 DIAGNOSIS — I10 Essential (primary) hypertension: Secondary | ICD-10-CM | POA: Diagnosis not present

## 2021-12-07 DIAGNOSIS — M6530 Trigger finger, unspecified finger: Secondary | ICD-10-CM | POA: Diagnosis not present

## 2021-12-07 DIAGNOSIS — J309 Allergic rhinitis, unspecified: Secondary | ICD-10-CM | POA: Diagnosis not present

## 2021-12-08 DIAGNOSIS — Z79899 Other long term (current) drug therapy: Secondary | ICD-10-CM | POA: Diagnosis not present

## 2023-08-16 ENCOUNTER — Ambulatory Visit (INDEPENDENT_AMBULATORY_CARE_PROVIDER_SITE_OTHER): Admitting: Radiology

## 2023-08-16 ENCOUNTER — Ambulatory Visit
Admission: EM | Admit: 2023-08-16 | Discharge: 2023-08-16 | Disposition: A | Discharge status: Home / Self Care | Attending: Physician Assistant | Admitting: Physician Assistant

## 2023-08-16 ENCOUNTER — Emergency Department (HOSPITAL_COMMUNITY)

## 2023-08-16 ENCOUNTER — Other Ambulatory Visit: Payer: Self-pay

## 2023-08-16 ENCOUNTER — Emergency Department (HOSPITAL_COMMUNITY)
Admission: EM | Admit: 2023-08-16 | Discharge: 2023-08-16 | Disposition: A | Discharge status: Home / Self Care | Attending: Emergency Medicine | Admitting: Emergency Medicine

## 2023-08-16 DIAGNOSIS — R0602 Shortness of breath: Secondary | ICD-10-CM

## 2023-08-16 DIAGNOSIS — R197 Diarrhea, unspecified: Secondary | ICD-10-CM | POA: Insufficient documentation

## 2023-08-16 DIAGNOSIS — R140 Abdominal distension (gaseous): Secondary | ICD-10-CM | POA: Diagnosis not present

## 2023-08-16 DIAGNOSIS — R1084 Generalized abdominal pain: Secondary | ICD-10-CM | POA: Insufficient documentation

## 2023-08-16 DIAGNOSIS — Z7901 Long term (current) use of anticoagulants: Secondary | ICD-10-CM | POA: Insufficient documentation

## 2023-08-16 DIAGNOSIS — R5383 Other fatigue: Secondary | ICD-10-CM | POA: Diagnosis not present

## 2023-08-16 DIAGNOSIS — R630 Anorexia: Secondary | ICD-10-CM | POA: Diagnosis not present

## 2023-08-16 DIAGNOSIS — R0689 Other abnormalities of breathing: Secondary | ICD-10-CM | POA: Diagnosis not present

## 2023-08-16 LAB — URINALYSIS, ROUTINE W REFLEX MICROSCOPIC
Bacteria, UA: NONE SEEN
Bilirubin Urine: NEGATIVE
Glucose, UA: NEGATIVE mg/dL
Hgb urine dipstick: NEGATIVE
Ketones, ur: 5 mg/dL — AB
Leukocytes,Ua: NEGATIVE
Nitrite: NEGATIVE
Protein, ur: 30 mg/dL — AB
Specific Gravity, Urine: 1.025 (ref 1.005–1.030)
pH: 5 (ref 5.0–8.0)

## 2023-08-16 LAB — CBC
HCT: 50.2 % (ref 39.0–52.0)
Hemoglobin: 15.9 g/dL (ref 13.0–17.0)
MCH: 29.9 pg (ref 26.0–34.0)
MCHC: 31.7 g/dL (ref 30.0–36.0)
MCV: 94.5 fL (ref 80.0–100.0)
Platelets: 230 10*3/uL (ref 150–400)
RBC: 5.31 MIL/uL (ref 4.22–5.81)
RDW: 14.3 % (ref 11.5–15.5)
WBC: 6.8 10*3/uL (ref 4.0–10.5)
nRBC: 0 % (ref 0.0–0.2)

## 2023-08-16 LAB — COMPREHENSIVE METABOLIC PANEL WITH GFR
ALT: 18 U/L (ref 0–44)
AST: 20 U/L (ref 15–41)
Albumin: 4 g/dL (ref 3.5–5.0)
Alkaline Phosphatase: 56 U/L (ref 38–126)
Anion gap: 13 (ref 5–15)
BUN: 16 mg/dL (ref 8–23)
CO2: 23 mmol/L (ref 22–32)
Calcium: 9.2 mg/dL (ref 8.9–10.3)
Chloride: 102 mmol/L (ref 98–111)
Creatinine, Ser: 1.42 mg/dL — ABNORMAL HIGH (ref 0.61–1.24)
GFR, Estimated: 51 mL/min — ABNORMAL LOW (ref >60)
Glucose, Bld: 90 mg/dL (ref 70–99)
Potassium: 3.7 mmol/L (ref 3.5–5.1)
Sodium: 138 mmol/L (ref 135–145)
Total Bilirubin: 1.2 mg/dL (ref 0.0–1.2)
Total Protein: 7.8 g/dL (ref 6.5–8.1)

## 2023-08-16 LAB — POC COVID19/FLU A&B COMBO
Covid Antigen, POC: NEGATIVE
Influenza A Antigen, POC: NEGATIVE
Influenza B Antigen, POC: NEGATIVE

## 2023-08-16 LAB — LIPASE, BLOOD: Lipase: 32 U/L (ref 11–51)

## 2023-08-16 MED ORDER — ONDANSETRON HCL 4 MG PO TABS: 4.0000 mg | ORAL_TABLET | Freq: Four times a day (QID) | ORAL | 0 refills | Status: AC

## 2023-08-16 MED ORDER — SODIUM CHLORIDE 0.9 % IV BOLUS
1000.0000 mL | Freq: Once | INTRAVENOUS | Status: AC
  Administered 2023-08-16: 1000 mL via INTRAVENOUS

## 2023-08-16 MED ORDER — IOHEXOL 300 MG/ML  SOLN: 100.0000 mL | Freq: Once | INTRAMUSCULAR | Status: DC | PRN

## 2023-08-16 NOTE — Discharge Instructions (Signed)
 Return for any problem.  If you feel worse, have persistent symptoms, or change your mind about getting a CT please return to the ED for evaluation.

## 2023-08-16 NOTE — ED Triage Notes (Signed)
 Pt came in after his doctor told him to go to the ED due to fatigue and diarrhea for the last six days.

## 2023-08-16 NOTE — ED Notes (Signed)
 Patient is being discharged from the Urgent Care and sent to the Emergency Department via private vehicle . Per Cleveland Dales Mecum PA, patient is in need of higher level of care due to chills, generalized body aches, unable to keep any fluids/solids down, and weakness x 6 days. Patient is aware and verbalizes understanding of plan of care.  Vitals:   08/16/23 1359  BP: (!) 155/85  Pulse: 64  Resp: 17  Temp: 98.1 F (36.7 C)  SpO2: 92%
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
## 2023-08-16 NOTE — Discharge Instructions (Addendum)
 You were seen today for concerns of shortness of breath, fatigue, decreased appetite and diarrhea.  Based on the fact that you have had limited oral intake over the last 6 days I suspect that you likely have some dehydration and fatigue.  After discussing this I recommend that you go to the emergency room for further evaluation and IV fluids.  You have declined EMS transportation and states that you can get yourself to the emergency room by your private vehicle.  If at any point you feel like you cannot make it there please pull off to the side of the road and call 911 for assistance.

## 2023-08-16 NOTE — ED Triage Notes (Signed)
 Pt presents with complaints of chills, generalized body aches, unable to keep any fluids/solids down, and weakness x 6 days. Pt currently rates his overall pain a 7/10. Denies taking OTC medications PTA. Unsure of fevers at home.

## 2023-08-16 NOTE — ED Provider Notes (Signed)
 [name] EMERGENC[facility] DEPARTMENT AT [facility] Provider Note   CSN: [id number] Arrival date & time: 08/16/23  1655     History  Chief Complaint  Patient presents with   Diarrhea    Glenn Logan is a 78 y.o. male.  78 year old male with prior medical history as detailed below presents for evaluation.  Patient reports persistent diarrhea.  Symptoms began 6 days ago.  He denies nausea or vomiting.  He complains of mild distention to his abdomen.  He feels gassy.  He reports loose watery diarrhea.  He denies bloody stool.  He denies fever.  The history is provided by the patient.       Home Medications Prior to Admission medications   Medication Sig Start Date End Date Taking? Authorizing Provider  budesonide-formoterol  (SYMBICORT) 160-4.5 MCG/ACT inhaler Inhale 2 puffs into the lungs 2 (two) times daily as needed (shortness of breath).     [provider]  cetirizine (ZYRTEC ALLERGY) 10 MG tablet Take 10 mg by mouth daily.    [provider]  gabapentin  (NEURONTIN ) 300 MG capsule Take 300 mg by mouth 3 (three) times daily.    [provider]  losartan  (COZAAR ) 100 MG tablet Take 50 mg by mouth daily.     [provider]  Nebulizers (HEALTHY LIVING COMPRESSOR/NEB) DEVI Use. 05/22/12   [provider]  ondansetron  (ZOFRAN ) 4 MG tablet Take 1 tablet (4 mg total) by mouth every 6 (six) hours. 08/16/23   Mecum, Erin E, PA-C  pantoprazole  (PROTONIX ) 40 MG tablet Take 40 mg by mouth daily.    [provider]  Polyethyl Glycol-Propyl Glycol (SYSTANE) 0.4-0.3 % SOLN Place 1 drop into both eyes in the morning and at bedtime.    [provider]  rivaroxaban  (XARELTO ) 20 MG TABS tablet Take 1 tablet (20 mg total) by mouth daily with supper. 12/09/19   Akula, Vijaya, MD  sertraline  (ZOLOFT ) 100 MG tablet Take 100 mg by mouth daily. 06/16/19   [provider]  zolpidem  (AMBIEN ) 10 MG tablet Take 10 mg by mouth at  bedtime.     [provider]      Allergies    Penicillins, Sulfa antibiotics, and Amoxicillin    Review of Systems   Review of Systems  All other systems reviewed and are negative.   Physical Exam Updated Vital Signs BP (!) 151/119   Pulse 88   Temp 98.3 F (36.8 C) (Oral)   Resp 16   SpO2 100%  Physical Exam Vitals and nursing note reviewed.  Constitutional:      General: He is not in acute distress.    Appearance: Normal appearance. He is well-developed.  HENT:     Head: Normocephalic and atraumatic.  Eyes:     Conjunctiva/sclera: Conjunctivae normal.     Pupils: Pupils are equal, round, and reactive to light.  Cardiovascular:     Rate and Rhythm: Normal rate and regular rhythm.     Heart sounds: Normal heart sounds.  Pulmonary:     Effort: Pulmonary effort is normal. No respiratory distress.     Breath sounds: Normal breath sounds.  Abdominal:     General: There is distension.     Palpations: Abdomen is soft.     Tenderness: There is abdominal tenderness.  Musculoskeletal:        General: No deformity. Normal range of motion.     Cervical back: Normal range of motion and neck supple.  Skin:  General: Skin is warm and dry.  Neurological:     General: No focal deficit present.     Mental Status: He is alert and oriented to person, place, and time.     ED Results / Procedures / Treatments   Labs (all labs ordered are listed, but only abnormal results are displayed) Labs Reviewed  COMPREHENSIVE METABOLIC PANEL WITH GFR - Abnormal; Notable for the following components:      Result Value   Creatinine, Ser 1.42 (*)    GFR, Estimated 51 (*)    All other components within normal limits  LIPASE, BLOOD  CBC  URINALYSIS, ROUTINE W REFLEX MICROSCOPIC    EKG None  Radiology DG Chest 2 View Result Date: 08/16/2023 CLINICAL DATA:  Chills, weakness. EXAM: CHEST - 2 VIEW COMPARISON:  September 16, 2013. FINDINGS: The heart size and mediastinal contours are  within normal limits. Both lungs are clear. The visualized skeletal structures are unremarkable. IMPRESSION: No active cardiopulmonary disease. Electronically Signed   By: Rosalene Colon [name]   On: 08/16/2023 15:22    Procedures Procedures    Medications Ordered in ED Medications  sodium chloride  0.9 % bolus 1,000 mL (has no administration in time range)    ED Course/ Medical Decision Making/ A&P                                 Medical Decision Making Amount and/or Complexity of Data Reviewed Labs: ordered. Radiology: ordered.  Risk Prescription drug management.    Medical Screen Complete  This patient presented to the ED with complaint of diarrhea.  This complaint involves an extensive number of treatment options. The initial differential diagnosis includes, but is not limited to, metabolic abnormality, infection, intra-abdominal pathology, etc.  This presentation is: Acute, Self-Limited, Previously Undiagnosed, Uncertain Prognosis, Complicated, Systemic Symptoms, and Threat to Life/Bodily Function  Patient is presenting with complaint of diarrhea x 1 week.  Patient denies nausea or vomiting.  He endorsed mild diffuse abdominal pain.  Screening labs obtained are without significant acute abnormality.  Patient refuses CT abdomen pelvis.  With IV fluids patient feels much improved.  He desires discharge.  Importance of close follow-up is stressed.  Strict return precautions given and understood.  Patient has capacity to refuse care including recommended CT abdomen pelvis.   Additional history obtained:  External records from outside sources obtained and reviewed including prior ED visits and prior Inpatient records.   Problem List / ED Course:  Diarrhea   Reevaluation:  After the interventions noted above, I reevaluated the patient and found that they have: improved  Disposition:  After consideration of the diagnostic results and the patients response  to treatment, I feel that the patent would benefit from close outpatient follow-up.          Final Clinical Impression(s) / ED Diagnoses Final diagnoses:  Diarrhea, unspecified type    Rx / DC Orders ED Discharge Orders     None         Burnette Carte, MD 08/16/23 2241

## 2023-12-05 ENCOUNTER — Other Ambulatory Visit: Payer: Self-pay | Admitting: Nurse Practitioner

## 2023-12-05 DIAGNOSIS — R413 Other amnesia: Secondary | ICD-10-CM
